# Patient Record
Sex: Male | Born: 1990
Health system: Southern US, Community
[De-identification: ages and names within clinical notes are randomized; demographics above are authoritative.]

## PROBLEM LIST (undated history)

## (undated) DIAGNOSIS — T7840XA Allergy, unspecified, initial encounter: Secondary | ICD-10-CM

## (undated) DIAGNOSIS — Z5189 Encounter for other specified aftercare: Secondary | ICD-10-CM

## (undated) DIAGNOSIS — G8929 Other chronic pain: Secondary | ICD-10-CM

## (undated) DIAGNOSIS — E785 Hyperlipidemia, unspecified: Secondary | ICD-10-CM

## (undated) DIAGNOSIS — R519 Headache, unspecified: Secondary | ICD-10-CM

## (undated) DIAGNOSIS — J45909 Unspecified asthma, uncomplicated: Secondary | ICD-10-CM

## (undated) DIAGNOSIS — H539 Unspecified visual disturbance: Secondary | ICD-10-CM

## (undated) DIAGNOSIS — F32A Depression, unspecified: Secondary | ICD-10-CM

## (undated) DIAGNOSIS — R569 Unspecified convulsions: Secondary | ICD-10-CM

## (undated) DIAGNOSIS — F329 Major depressive disorder, single episode, unspecified: Secondary | ICD-10-CM

## (undated) HISTORY — DX: Allergy, unspecified, initial encounter: T78.40XA

## (undated) HISTORY — DX: Headache, unspecified: R51.9

## (undated) HISTORY — DX: Depression, unspecified: F32.A

## (undated) HISTORY — DX: Unspecified visual disturbance: H53.9

## (undated) HISTORY — DX: Hyperlipidemia, unspecified: E78.5

## (undated) HISTORY — DX: Unspecified asthma, uncomplicated: J45.909

## (undated) HISTORY — DX: Other chronic pain: G89.29

---
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
## 2012-08-16 ENCOUNTER — Emergency Department (HOSPITAL_COMMUNITY): Payer: Self-pay

## 2012-08-16 ENCOUNTER — Encounter (HOSPITAL_COMMUNITY): Payer: Self-pay

## 2012-08-16 ENCOUNTER — Emergency Department (HOSPITAL_COMMUNITY)
Admission: EM | Admit: 2012-08-16 | Discharge: 2012-08-16 | Disposition: A | Discharge status: Home / Self Care | Payer: Self-pay | Attending: Emergency Medicine | Admitting: Emergency Medicine

## 2012-08-16 DIAGNOSIS — F17200 Nicotine dependence, unspecified, uncomplicated: Secondary | ICD-10-CM | POA: Insufficient documentation

## 2012-08-16 DIAGNOSIS — S93402A Sprain of unspecified ligament of left ankle, initial encounter: Secondary | ICD-10-CM

## 2012-08-16 DIAGNOSIS — W1809XA Striking against other object with subsequent fall, initial encounter: Secondary | ICD-10-CM | POA: Insufficient documentation

## 2012-08-16 DIAGNOSIS — S93409A Sprain of unspecified ligament of unspecified ankle, initial encounter: Secondary | ICD-10-CM | POA: Insufficient documentation

## 2012-08-16 DIAGNOSIS — Y9239 Other specified sports and athletic area as the place of occurrence of the external cause: Secondary | ICD-10-CM | POA: Insufficient documentation

## 2012-08-16 DIAGNOSIS — Y9367 Activity, basketball: Secondary | ICD-10-CM | POA: Insufficient documentation

## 2012-08-16 MED ORDER — HYDROCODONE-ACETAMINOPHEN 5-325 MG PO TABS
1.0000 | ORAL_TABLET | Freq: Once | ORAL | Status: AC
  Administered 2012-08-16: 1 via ORAL
  Filled 2012-08-16: qty 1

## 2012-08-16 MED ORDER — HYDROCODONE-ACETAMINOPHEN 5-325 MG PO TABS: ORAL_TABLET | ORAL | Status: DC

## 2012-08-16 MED ORDER — IBUPROFEN 600 MG PO TABS: ORAL_TABLET | ORAL | Status: DC

## 2012-08-16 MED ORDER — IBUPROFEN 800 MG PO TABS
800.0000 mg | ORAL_TABLET | Freq: Once | ORAL | Status: AC
  Administered 2012-08-16: 800 mg via ORAL
  Filled 2012-08-16: qty 1

## 2012-08-16 NOTE — ED Provider Notes (Signed)
Medical screening examination/treatment/procedure(s) were performed by non-physician practitioner and as supervising physician I was immediately available for consultation/collaboration.   Elliott L Wentz, MD 08/16/12 2335 

## 2012-08-16 NOTE — ED Provider Notes (Signed)
History     CSN: [id number]  Arrival date & time 08/16/12  1847   First MD Initiated Contact with Patient 08/16/12 1926      Chief Complaint  Patient presents with  . Ankle Pain    (Consider location/radiation/quality/duration/timing/severity/associated sxs/prior treatment) Patient is a 22 y.o. male presenting with ankle pain. The history is provided by the patient and a parent.  Ankle Pain Location:  Ankle Injury: yes   Mechanism of injury: fall   Fall:    Fall occurred:  Recreating/playing   Impact surface:  Athletic surface Ankle location:  L ankle Pain details:    Quality:  Throbbing   Radiates to:  Does not radiate   Severity:  Moderate   Onset quality:  Sudden   Timing:  Constant   Progression:  Worsening Chronicity:  New Dislocation: no   Foreign body present:  No foreign bodies Prior injury to area:  No Relieved by:  Nothing Worsened by:  Bearing weight Ineffective treatments:  None tried Associated symptoms: swelling   Associated symptoms: no back pain, no neck pain, no numbness and no tingling   Risk factors: no known bone disorder and no recent illness     History reviewed. No pertinent past medical history.  Past Surgical History  Procedure Laterality Date  . Brain surgery      hematoma    No family history on file.  History  Substance Use Topics  . Smoking status: Current Every Day Smoker  . Smokeless tobacco: Not on file  . Alcohol Use: No      Review of Systems  Constitutional: Negative for activity change.       All ROS Neg except as noted in HPI  HENT: Negative for nosebleeds and neck pain.   Eyes: Negative for photophobia and discharge.  Respiratory: Negative for cough, shortness of breath and wheezing.   Cardiovascular: Negative for chest pain and palpitations.  Gastrointestinal: Negative for abdominal pain and blood in stool.  Genitourinary: Negative for dysuria, frequency and hematuria.  Musculoskeletal: Negative for back pain  and arthralgias.  Skin: Negative.   Neurological: Negative for dizziness, seizures and speech difficulty.  Psychiatric/Behavioral: Negative for hallucinations and confusion.    Allergies  Penicillins and Cephalexin  Home Medications  No current outpatient prescriptions on file.  BP 125/63  Pulse 92  Temp(Src) 98.2 F (36.8 C) (Oral)  Resp 18  Ht 5\' 10"  (1.778 m)  Wt 188 lb (85.276 kg)  BMI 26.98 kg/m2  SpO2 99%  Physical Exam  Nursing note and vitals reviewed. Constitutional: He is oriented to person, place, and time. He appears well-developed and well-nourished.  Non-toxic appearance.  HENT:  Head: Normocephalic.  Right Ear: Tympanic membrane and external ear normal.  Left Ear: Tympanic membrane and external ear normal.  Eyes: EOM and lids are normal. Pupils are equal, round, and reactive to light.  Neck: Normal range of motion. Neck supple. Carotid bruit is not present.  Cardiovascular: Normal rate, regular rhythm, normal heart sounds, intact distal pulses and normal pulses.   Pulmonary/Chest: Breath sounds normal. No respiratory distress.  Abdominal: Soft. Bowel sounds are normal. There is no tenderness. There is no guarding.  Musculoskeletal: Normal range of motion.  There is pain and moderate swelling of the left lateral malleolus. There is full range of motion of the toes of the left foot. The Achilles tendon is intact. There is no deformity of the tib-fib area or the knee.  Lymphadenopathy:  Head (right side): No submandibular adenopathy present.       Head (left side): No submandibular adenopathy present.    He has no cervical adenopathy.  Neurological: He is alert and oriented to person, place, and time. He has normal strength. No cranial nerve deficit or sensory deficit.  Skin: Skin is warm and dry.  Psychiatric: He has a normal mood and affect. His speech is normal.    ED Course  Procedures (including critical care time)  Labs Reviewed - No data to  display Dg Ankle Complete Left  08/16/2012  *RADIOLOGY REPORT*  Clinical Data: Pain after basketball injury.  LEFT ANKLE COMPLETE - 3+ VIEW  Comparison: None.  Findings: Ankle mortise intact. Negative for fracture, dislocation, or other acute abnormality.  Normal alignment and mineralization. No significant degenerative change.  Regional soft tissues unremarkable.  IMPRESSION:  Negative   Original Report Authenticated By: D. Andria Rhein, MD      No diagnosis found.    MDM  I have reviewed nursing notes, vital signs, and all appropriate lab and imaging results for this patient. Patient states he was playing basketball when he landed on his left foot and ankle and a wrong angle and fell. The x-ray of the left ankle is negative for fracture or dislocation. I suspect the patient has a ankle sprain. The plan at this time is for the patient be fitted with an ankle stirrup splint, and an ice pack to be given. Patient states he has crutches at home and declines crutches at this time. Patient given a prescription for ibuprofen 600 mg 13 times daily, as well as Norco 5 mg one at bedtime. Patient is to see orthopedics if not improving.       Kathie Dike, PA-C 08/16/12 2258

## 2012-08-16 NOTE — ED Notes (Signed)
Pt reports was playing basketball.  Says jumped and landed wrong on left ankle.

## 2014-01-05 ENCOUNTER — Encounter (HOSPITAL_COMMUNITY): Payer: Self-pay | Admitting: Emergency Medicine

## 2014-01-05 ENCOUNTER — Emergency Department (INDEPENDENT_AMBULATORY_CARE_PROVIDER_SITE_OTHER): Payer: 59

## 2014-01-05 ENCOUNTER — Emergency Department (HOSPITAL_COMMUNITY)
Admission: EM | Admit: 2014-01-05 | Discharge: 2014-01-05 | Disposition: A | Discharge status: Home / Self Care | Payer: 59 | Source: Home / Self Care | Attending: Family Medicine | Admitting: Family Medicine

## 2014-01-05 DIAGNOSIS — S93609A Unspecified sprain of unspecified foot, initial encounter: Secondary | ICD-10-CM

## 2014-01-05 DIAGNOSIS — S93601A Unspecified sprain of right foot, initial encounter: Secondary | ICD-10-CM

## 2014-01-05 NOTE — Discharge Instructions (Signed)
Wear foot  support as needed for comfort, activity as tolerated. advil and ice as needed, return or see orthopedist if further problems.

## 2014-01-05 NOTE — ED Notes (Signed)
Pt  Reports  He  Injured  His  r   Foot    Yesterday   Landed   On  somebodys  Foot  Playing  Basketball  Pain and  Swelling  Present

## 2014-01-05 NOTE — ED Provider Notes (Addendum)
CSN: [id number]     Arrival date & time 01/05/14  1644 History   None    Chief Complaint  Patient presents with  . Foot Injury   (Consider location/radiation/quality/duration/timing/severity/associated sxs/prior Treatment) Patient is a 23 y.o. male presenting with ankle pain. The history is provided by the patient.  Ankle Pain Location:  Foot Time since incident:  1 day Injury: yes   Mechanism of injury comment:  Twisted playing bball. Foot location:  R foot Pain details:    Radiates to:  Does not radiate Chronicity:  Recurrent Dislocation: no   Foreign body present:  No foreign bodies Prior injury to area:  Yes Relieved by: camwalker splint. Associated symptoms: back pain, decreased ROM, stiffness and swelling     History reviewed. No pertinent past medical history. Past Surgical History  Procedure Laterality Date  . Brain surgery      hematoma   History reviewed. No pertinent family history. History  Substance Use Topics  . Smoking status: Current Every Day Smoker  . Smokeless tobacco: Not on file  . Alcohol Use: No    Review of Systems  Constitutional: Negative.   Musculoskeletal: Positive for back pain, gait problem, joint swelling and stiffness.  Skin: Negative.     Allergies  Penicillins and Cephalexin  Home Medications   Prior to Admission medications   Medication Sig Start Date End Date Taking? Authorizing Provider  HYDROcodone-acetaminophen (NORCO/VICODIN) 5-325 MG per tablet 1 at at bedtime if needed for pain, or one every 4 hours if needed for severe pain. 08/16/12   Lenox Ahr, PA-C  ibuprofen (ADVIL,MOTRIN) 600 MG tablet 1 po tid with food 08/16/12   Lenox Ahr, PA-C   BP 129/75  Pulse 66  Temp(Src) 97.5 F (36.4 C) (Oral)  SpO2 96% Physical Exam  Nursing note and vitals reviewed. Constitutional: He is oriented to person, place, and time. He appears well-developed and well-nourished.  Musculoskeletal: He exhibits tenderness.   Right foot: He exhibits tenderness and swelling. He exhibits no deformity and no laceration.       Feet:  Neurological: He is alert and oriented to person, place, and time.  Skin: Skin is warm and dry.    ED Course  Procedures (including critical care time) Labs Review Labs Reviewed - No data to display  Imaging Review Dg Foot Complete Right  01/05/2014   CLINICAL DATA:  Injury to the right foot complaining foot pain.  EXAM: RIGHT FOOT COMPLETE - 3+ VIEW  COMPARISON:  No priors.  FINDINGS: Multiple views of the right foot demonstrate no acute displaced fracture, subluxation, dislocation, or soft tissue abnormality.  IMPRESSION: No acute radiographic abnormality of the right foot.   Electronically Signed   By: Vinnie Langton M.D.   On: 01/05/2014 17:31    X-rays reviewed and report per radiologist.  MDM   1. Sprain of foot, right, initial encounter        Billy Fischer, MD 01/05/14 1731  Billy Fischer, MD 01/05/14 1901

## 2014-02-19 ENCOUNTER — Encounter: Payer: Self-pay | Admitting: Family Medicine

## 2014-02-19 ENCOUNTER — Ambulatory Visit (INDEPENDENT_AMBULATORY_CARE_PROVIDER_SITE_OTHER): Payer: 59 | Admitting: Family Medicine

## 2014-02-19 VITALS — BP 134/78 | Temp 98.8°F | Ht 70.0 in | Wt 200.0 lb

## 2014-02-19 DIAGNOSIS — Z23 Encounter for immunization: Secondary | ICD-10-CM

## 2014-02-19 DIAGNOSIS — T300 Burn of unspecified body region, unspecified degree: Secondary | ICD-10-CM

## 2014-02-19 MED ORDER — SILVER SULFADIAZINE 1 % EX CREA: 1.0000 "application " | TOPICAL_CREAM | Freq: Every day | CUTANEOUS | Status: DC

## 2014-02-19 MED ORDER — AZITHROMYCIN 250 MG PO TABS: ORAL_TABLET | ORAL | Status: DC
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
## 2014-02-22 ENCOUNTER — Other Ambulatory Visit: Payer: Self-pay | Admitting: Family Medicine

## 2014-02-25 ENCOUNTER — Encounter: Payer: Self-pay | Admitting: Family Medicine

## 2014-02-25 ENCOUNTER — Ambulatory Visit (INDEPENDENT_AMBULATORY_CARE_PROVIDER_SITE_OTHER): Payer: 59 | Admitting: Family Medicine

## 2014-02-25 VITALS — BP 130/80 | Ht 70.0 in | Wt 198.0 lb

## 2014-02-25 DIAGNOSIS — T300 Burn of unspecified body region, unspecified degree: Secondary | ICD-10-CM

## 2014-02-25 NOTE — Patient Instructions (Signed)
Clean wound and apply dressing tewice per d
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
## 2014-03-01 ENCOUNTER — Encounter: Payer: Self-pay | Admitting: Family Medicine

## 2014-03-01 ENCOUNTER — Ambulatory Visit (INDEPENDENT_AMBULATORY_CARE_PROVIDER_SITE_OTHER): Payer: 59 | Admitting: Family Medicine

## 2014-03-01 ENCOUNTER — Other Ambulatory Visit: Payer: Self-pay | Admitting: Family Medicine

## 2014-03-01 VITALS — BP 100/70 | Ht 70.0 in | Wt 198.0 lb

## 2014-03-01 DIAGNOSIS — T300 Burn of unspecified body region, unspecified degree: Secondary | ICD-10-CM

## 2014-03-01 MED ORDER — CLARITHROMYCIN 500 MG PO TABS: 500.0000 mg | ORAL_TABLET | Freq: Two times a day (BID) | ORAL | Status: DC
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
## 2014-03-13 DIAGNOSIS — Z0289 Encounter for other administrative examinations: Secondary | ICD-10-CM

## 2016-11-03 ENCOUNTER — Other Ambulatory Visit: Payer: Self-pay | Admitting: Internal Medicine

## 2016-11-03 DIAGNOSIS — H539 Unspecified visual disturbance: Secondary | ICD-10-CM

## 2016-11-03 DIAGNOSIS — R519 Headache, unspecified: Secondary | ICD-10-CM

## 2016-11-03 DIAGNOSIS — R51 Headache: Secondary | ICD-10-CM | POA: Diagnosis not present

## 2016-11-03 DIAGNOSIS — S06890A Other specified intracranial injury without loss of consciousness, initial encounter: Secondary | ICD-10-CM | POA: Diagnosis not present

## 2016-11-22 ENCOUNTER — Encounter: Payer: Self-pay | Admitting: *Deleted

## 2016-11-23 ENCOUNTER — Encounter: Payer: Self-pay | Admitting: Diagnostic Neuroimaging

## 2016-11-23 ENCOUNTER — Ambulatory Visit
Admission: RE | Admit: 2016-11-23 | Discharge: 2016-11-23 | Disposition: A | Discharge status: Home / Self Care | Payer: 59 | Source: Ambulatory Visit | Attending: Internal Medicine | Admitting: Internal Medicine

## 2016-11-23 ENCOUNTER — Ambulatory Visit (INDEPENDENT_AMBULATORY_CARE_PROVIDER_SITE_OTHER): Payer: 59 | Admitting: Diagnostic Neuroimaging

## 2016-11-23 ENCOUNTER — Encounter (INDEPENDENT_AMBULATORY_CARE_PROVIDER_SITE_OTHER): Payer: Self-pay

## 2016-11-23 VITALS — BP 118/67 | HR 57 | Ht 70.0 in | Wt <= 1120 oz

## 2016-11-23 DIAGNOSIS — R519 Headache, unspecified: Secondary | ICD-10-CM

## 2016-11-23 DIAGNOSIS — R51 Headache: Principal | ICD-10-CM

## 2016-11-23 DIAGNOSIS — G44329 Chronic post-traumatic headache, not intractable: Secondary | ICD-10-CM

## 2016-11-23 DIAGNOSIS — H539 Unspecified visual disturbance: Secondary | ICD-10-CM

## 2016-11-23 DIAGNOSIS — G44209 Tension-type headache, unspecified, not intractable: Secondary | ICD-10-CM | POA: Diagnosis not present

## 2016-11-23 DIAGNOSIS — G43009 Migraine without aura, not intractable, without status migrainosus: Secondary | ICD-10-CM
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
## 2016-11-23 NOTE — Patient Instructions (Signed)
Thank you for coming to see [place] at [facility]. I hope we have been able to provide you high quality care today.  You may receive a patient satisfaction survey over the next few weeks. We would appreciate your feedback and comments so that we may continue to improve ourselves and the health of our patients.   To prevent or relieve headaches, try the following:   Cool Compress. Lie down and place a cool compress on your head.   Avoid headache triggers. If certain foods or odors seem to have triggered your migraines in the past, avoid them. A headache diary might help you identify triggers.   Include physical activity in your daily routine.   Manage stress. Find healthy ways to cope with the stressors, such as delegating tasks on your to-do list.   Practice relaxation techniques. Try deep breathing, yoga, massage and visualization.   Eat regularly. Eating regularly scheduled meals and maintaining a healthy diet might help prevent headaches. Also, drink plenty of fluids.   Follow a regular sleep schedule. Sleep deprivation might contribute to headaches  Consider biofeedback. With this mind-body technique, you learn to control certain bodily functions - such as muscle tension, heart rate and blood pressure - to prevent headaches or reduce headache pain.  Consider melatonin to help reset sleep cycle. Take 1 tab before going to sleep, especially when changing sleep schedule.    ~~~~~~~~~~~~~~~~~~~~~~~~~~~~~~~~~~~~~~~~~~~~~~~~~~~~~~~~~~~~~~~~~  DR. Brannon Decaire'S GUIDE TO HAPPY AND HEALTHY LIVING These are some of my general health and wellness recommendations. Some of them may apply to you better than others. Please use common sense as you try these suggestions and feel free to ask me any questions.   ACTIVITY/FITNESS Mental, social, emotional and physical stimulation are very important for brain and body health. Try learning a new activity (arts, music, language,  sports, games).  Keep moving your body to the best of your abilities. You can do this at home, inside or outside, the park, community center, gym or anywhere you like. Consider a physical therapist or personal trainer to get started. Consider the app Sworkit. Fitness trackers such as smart-watches, smart-phones or Fitbits can help as well.   NUTRITION Eat more plants: colorful vegetables, nuts, seeds and berries.  Eat less sugar, salt, preservatives and processed foods.  Avoid toxins such as cigarettes and alcohol.  Drink water when you are thirsty. Warm water with a slice of lemon is an excellent morning drink to start the day.  Consider these websites for more information The Nutrition Source (https://www.henry-hernandez.biz/) [employer] Nutrition (WindowBlog.ch)   RELAXATION Consider practicing mindfulness meditation or other relaxation techniques such as deep breathing, prayer, yoga, tai chi, massage. See website mindful.org or the apps Headspace or Calm to help get started.   SLEEP Try to get at least 7-8+ hours sleep per day. Regular exercise and reduced caffeine will help you sleep better. Practice good sleep hygeine techniques. See website sleep.org for more information.   PLANNING Prepare estate planning, living will, healthcare POA documents. Sometimes this is best planned with the help of an attorney. Theconversationproject.org and agingwithdignity.org are excellent resources.

## 2016-12-27 DIAGNOSIS — Z0000 Encounter for general adult medical examination without abnormal findings: Secondary | ICD-10-CM | POA: Diagnosis not present

## 2017-01-13 DIAGNOSIS — R740 Nonspecific elevation of levels of transaminase and lactic acid dehydrogenase [LDH]: Secondary | ICD-10-CM | POA: Diagnosis not present

## 2017-01-13 DIAGNOSIS — R7989 Other specified abnormal findings of blood chemistry: Secondary | ICD-10-CM | POA: Diagnosis not present

## 2017-01-13 DIAGNOSIS — Z0000 Encounter for general adult medical examination without abnormal findings: Secondary | ICD-10-CM | POA: Diagnosis not present

## 2017-01-14 ENCOUNTER — Other Ambulatory Visit: Payer: Self-pay | Admitting: Internal Medicine

## 2017-01-14 DIAGNOSIS — R7401 Elevation of levels of liver transaminase levels: Secondary | ICD-10-CM

## 2017-01-14 DIAGNOSIS — R740 Nonspecific elevation of levels of transaminase and lactic acid dehydrogenase [LDH]: Principal | ICD-10-CM

## 2017-01-19 ENCOUNTER — Ambulatory Visit
Admission: RE | Admit: 2017-01-19 | Discharge: 2017-01-19 | Disposition: A | Discharge status: Home / Self Care | Payer: 59 | Source: Ambulatory Visit | Attending: Internal Medicine | Admitting: Internal Medicine

## 2017-01-19 DIAGNOSIS — R7401 Elevation of levels of liver transaminase levels: Secondary | ICD-10-CM

## 2017-01-19 DIAGNOSIS — R7989 Other specified abnormal findings of blood chemistry: Secondary | ICD-10-CM | POA: Diagnosis not present

## 2017-01-19 DIAGNOSIS — R740 Nonspecific elevation of levels of transaminase and lactic acid dehydrogenase [LDH]: Principal | ICD-10-CM

## 2017-03-15 ENCOUNTER — Encounter: Payer: Self-pay | Admitting: Diagnostic Neuroimaging

## 2017-03-15 ENCOUNTER — Encounter (INDEPENDENT_AMBULATORY_CARE_PROVIDER_SITE_OTHER): Payer: Self-pay

## 2017-03-15 ENCOUNTER — Ambulatory Visit (INDEPENDENT_AMBULATORY_CARE_PROVIDER_SITE_OTHER): Payer: 59 | Admitting: Diagnostic Neuroimaging

## 2017-03-15 VITALS — BP 135/70 | HR 58 | Wt 203.4 lb

## 2017-03-15 DIAGNOSIS — G43009 Migraine without aura, not intractable, without status migrainosus: Secondary | ICD-10-CM

## 2017-03-15 DIAGNOSIS — G44209 Tension-type headache, unspecified, not intractable: Secondary | ICD-10-CM | POA: Diagnosis not present

## 2017-03-15 DIAGNOSIS — G44329 Chronic post-traumatic headache, not intractable: Secondary | ICD-10-CM
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
## 2017-04-12 DIAGNOSIS — E785 Hyperlipidemia, unspecified: Secondary | ICD-10-CM | POA: Diagnosis not present

## 2017-04-22 DIAGNOSIS — E785 Hyperlipidemia, unspecified: Secondary | ICD-10-CM | POA: Diagnosis not present

## 2017-04-22 DIAGNOSIS — R748 Abnormal levels of other serum enzymes: Secondary | ICD-10-CM | POA: Diagnosis not present

## 2017-04-22 DIAGNOSIS — E039 Hypothyroidism, unspecified: Secondary | ICD-10-CM | POA: Diagnosis not present

## 2017-04-22 DIAGNOSIS — Z23 Encounter for immunization: Secondary | ICD-10-CM | POA: Diagnosis not present

## 2017-12-20 DIAGNOSIS — Z0000 Encounter for general adult medical examination without abnormal findings: Secondary | ICD-10-CM | POA: Diagnosis not present

## 2018-08-21 ENCOUNTER — Other Ambulatory Visit: Payer: Self-pay

## 2018-08-21 ENCOUNTER — Ambulatory Visit (HOSPITAL_COMMUNITY)
Admission: EM | Admit: 2018-08-21 | Discharge: 2018-08-21 | Disposition: A | Discharge status: Home / Self Care | Payer: 59 | Attending: Emergency Medicine | Admitting: Emergency Medicine

## 2018-08-21 ENCOUNTER — Encounter (HOSPITAL_COMMUNITY): Payer: Self-pay

## 2018-08-21 DIAGNOSIS — H6502 Acute serous otitis media, left ear: Secondary | ICD-10-CM | POA: Diagnosis not present

## 2018-08-21 DIAGNOSIS — R110 Nausea: Secondary | ICD-10-CM | POA: Diagnosis not present

## 2018-08-21 DIAGNOSIS — R519 Headache, unspecified: Secondary | ICD-10-CM

## 2018-08-21 DIAGNOSIS — J321 Chronic frontal sinusitis: Secondary | ICD-10-CM

## 2018-08-21 DIAGNOSIS — R51 Headache: Secondary | ICD-10-CM | POA: Diagnosis not present

## 2018-08-21 MED ORDER — PREDNISONE 10 MG (21) PO TBPK: ORAL_TABLET | Freq: Every day | ORAL | 0 refills | Status: DC

## 2018-08-21 MED ORDER — AZITHROMYCIN 250 MG PO TABS: 250.0000 mg | ORAL_TABLET | Freq: Every day | ORAL | 0 refills | Status: DC

## 2018-08-21 NOTE — ED Provider Notes (Signed)
[place]    CSN: [id number] Arrival date & time: 08/21/18  0815     History   Chief Complaint Chief Complaint  Patient presents with  . Nausea    HPI James Andrade is a 28 y.o. male.   Nausea, sinus pressure, headache, and not feeling well for the past 3 days now. States no emesis no loose bowels,no fevers  has not taken anything pta.   Wife was concerned about dark stools pt did not feel this was an issue. She wanted to know if we could refer to GI       History reviewed. No pertinent past medical history.  Patient Active Problem List   Diagnosis Date Noted  . SINUS BRADYCARDIA 03/24/2010  . WRIST PAIN, LEFT 09/18/2008  . ANKLE PAIN 09/18/2008  . FRACTURE, MEDIAL MALLEOLUS 04/17/2007    Past Surgical History:  Procedure Laterality Date  . BRAIN SURGERY  2010   hematoma from fall onto back of head, Dr Sherwood Gambler  . WISDOM TOOTH EXTRACTION  2012       Home Medications    Prior to Admission medications   Medication Sig Start Date End Date Taking? Authorizing Provider  azithromycin (ZITHROMAX) 250 MG tablet Take 1 tablet (250 mg total) by mouth daily. Take first 2 tablets together, then 1 every day until finished. 08/21/18   Marney Setting, NP  ibuprofen (ADVIL,MOTRIN) 200 MG tablet Take 200 mg by mouth every 6 (six) hours as needed.    [provider]  Melatonin 5 MG TABS Take by mouth.    [provider]  predniSONE (STERAPRED UNI-PAK 21 TAB) 10 MG (21) TBPK tablet Take by mouth daily. Take 6 tabs by mouth daily  for 2 days, then 5 tabs for 2 days, then 4 tabs for 2 days, then 3 tabs for 2 days, 2 tabs for 2 days, then 1 tab by mouth daily for 2 days 08/21/18   Marney Setting, NP    Family History History reviewed. No pertinent family history.  Social History Social History   Tobacco Use  . Smoking status: Current Every Day Smoker    Types: Cigarettes  . Smokeless tobacco: Never Used  . Tobacco comment:  11/23/16 10 daily, 03/15/17 cut back to a few daily  Substance Use Topics  . Alcohol use: Yes    Comment: 11/26/16 2 weekly  . Drug use: No     Allergies   Penicillins; Sulfa antibiotics; and Cephalexin   Review of Systems Review of Systems  Constitutional: Positive for fatigue.  HENT: Positive for congestion, ear pain, postnasal drip, sinus pressure, sinus pain and sneezing.   Eyes: Negative.   Respiratory: Negative.   Cardiovascular: Negative.   Gastrointestinal: Positive for nausea.       Wife with pt was concerned about blood in stool   Genitourinary: Negative.   Musculoskeletal: Negative.   Neurological: Positive for headaches.     Physical Exam Triage Vital Signs ED Triage Vitals  Enc Vitals Group     BP 08/21/18 0900 130/78     Pulse Rate 08/21/18 0900 76     Resp 08/21/18 0900 18     Temp 08/21/18 0900 98.3 F (36.8 C)     Temp Source 08/21/18 0900 Oral     SpO2 08/21/18 0900 97 %     Weight --      Height --      Head Circumference --      Peak Flow --  Pain Score 08/21/18 0856 4     Pain Loc --      Pain Edu? --      Excl. in [place]? --    No data found.  Updated Vital Signs BP 130/78 (BP Location: Right Arm)   Pulse 76   Temp 98.3 F (36.8 C) (Oral)   Resp 18   SpO2 97%   Visual Acuity     Physical Exam HENT:     Head: Normocephalic.     Ears:     Comments: bil bulging to TM , LT TM erythema     Nose: Congestion and rhinorrhea present.     Mouth/Throat:     Mouth: Mucous membranes are moist.  Eyes:     Pupils: Pupils are equal, round, and reactive to light.  Neck:     Musculoskeletal: Normal range of motion.  Cardiovascular:     Rate and Rhythm: Normal rate.     Pulses: Normal pulses.  Pulmonary:     Effort: Pulmonary effort is normal.  Abdominal:     General: Abdomen is flat. Bowel sounds are normal.  Musculoskeletal: Normal range of motion.  Skin:    General: Skin is warm.  Neurological:     General: No focal deficit  present.     Mental Status: He is alert.     Comments: Facial pressure to frontal and sinus       UC Treatments / Results  Labs (all labs ordered are listed, but only abnormal results are displayed) Labs Reviewed - No data to display  EKG None  Radiology No results found.  Procedures Procedures (including critical care time)  Medications Ordered in UC Medications - No data to display  Initial Impression / Assessment and Plan / UC Course  I have reviewed the triage vital signs and the nursing notes.  Pertinent labs & imaging results that were available during my care of the patient were reviewed by me and considered in my medical decision making (see chart for details).     Use humidifier to help  Take tylenol and motrin  For pain  Clear fluids  Ear infection  Stay hydrated  Final Clinical Impressions(s) / UC Diagnoses   Final diagnoses:  Nausea without vomiting  Chronic frontal sinusitis  Non-recurrent acute serous otitis media of left ear  Acute nonintractable headache, unspecified headache type     Discharge Instructions     Use humidifier to help  Take tylenol and motrin  For pain  Clear fluids  Ear infection  Stay hydrated     ED Prescriptions    Medication Sig Dispense Auth. Provider   predniSONE (STERAPRED UNI-PAK 21 TAB) 10 MG (21) TBPK tablet Take by mouth daily. Take 6 tabs by mouth daily  for 2 days, then 5 tabs for 2 days, then 4 tabs for 2 days, then 3 tabs for 2 days, 2 tabs for 2 days, then 1 tab by mouth daily for 2 days 42 tablet Morley Kos L, NP   azithromycin (ZITHROMAX) 250 MG tablet Take 1 tablet (250 mg total) by mouth daily. Take first 2 tablets together, then 1 every day until finished. 6 tablet Marney Setting, NP     Controlled Substance Prescriptions [place] Controlled Substance Registry consulted? Not Applicable   Marney Setting, NP 08/21/18 [phone]

## 2018-08-21 NOTE — ED Triage Notes (Signed)
Pt cc he states he feels congested and nauseous this started this morning at work. Pt has body aches.

## 2018-08-21 NOTE — Discharge Instructions (Addendum)
Use humidifier to help  Take tylenol and motrin  For pain  Clear fluids  Ear infection  Stay hydrated

## 2018-12-26 NOTE — Progress Notes (Signed)
TELEHEALTH VISIT  Referring Provider: Deland Pretty, MD Primary Care Physician:  Deland Pretty, MD   Tele-visit due to COVID-19 pandemic Patient requested visit virtually, consented to the virtual encounter via video enabled telemedicine application Contact made at: 14:31 12/27/18 Patient verified by name and date of birth Location of patient: Home Location provider: [place] [facility] Names of persons participating: Me, patient, Tinnie Gens CMA Time spent on telehealth visit: 28 minutes I discussed the limitations of evaluation and management by telemedicine. The patient expressed understanding and agreed to proceed.  Reason for Consultation: Bloody stools   IMPRESSION:  Rectal bleeding and rectal bulge reported by patient No known family history of colon cancer or polyps  The differential for rectal bleeding is broad.  It includes outlet sources such as fissure or hemorrhoids, as well as polyps, mass, ulcers, and colitis.  Given this differential I am recommending a colonoscopy.  PLAN: High fiber diet recommended, drink at least 1.5-2 liters of water Daily stool bulking agent with psyllium or methylcellulose recommended Colonoscopy  I consented the patient discussing the risks, benefits, and alternatives to endoscopic evaluation. In particular, we discussed the risks that include, but are not limited to, reaction to medication, cardiopulmonary compromise, bleeding requiring blood transfusion, aspiration resulting in pneumonia, perforation requiring surgery, lack of diagnosis, severe illness requiring hospitalization, and even death. We reviewed the risk of missed lesion including polyps or even cancer. The patient acknowledges these risks and asks that we proceed.  Please see the "Patient Instructions" section for addition details about the plan.  HPI: James Andrade is a 28 y.o. male referred by an Urgent Care in March 2020. The history is obtained through the  patient. His wife has been persistent about him coming to this appointment because of having some blood in the stool. She is a chemotherapy nurse at [employer].  January started noted BRB on the toilet paper and in the bowl with some bowel movements. Rectal pain with defecation is always there.  In March noted a rectal bulge and a right sided fullness. Wonders if it could be ripped from straining. No rectal exam has been performed by the patient or a health care provider.  No other associated symptoms. No identified exacerbating or relieving features.   Recently developed anorexia over the last month. Intermittent nausea over the last few weeks to months. Weight gain during quarantine. No other systemic complaints.   Prior abdominal imaging: Abdominal ultrasound 01/19/17: normal  No known family history of colon cancer or polyps. No family history of uterine/endometrial cancer, pancreatic cancer or gastric/stomach cancer.  No past medical history on file.  Past Surgical History:  Procedure Laterality Date  . BRAIN SURGERY  2010   hematoma from fall onto back of head, Dr Sherwood Gambler  . WISDOM TOOTH EXTRACTION  2012    Current Outpatient Medications  Medication Sig Dispense Refill  . azithromycin (ZITHROMAX) 250 MG tablet Take 1 tablet (250 mg total) by mouth daily. Take first 2 tablets together, then 1 every day until finished. 6 tablet 0  . ibuprofen (ADVIL,MOTRIN) 200 MG tablet Take 200 mg by mouth every 6 (six) hours as needed.    . Melatonin 5 MG TABS Take by mouth.    . predniSONE (STERAPRED UNI-PAK 21 TAB) 10 MG (21) TBPK tablet Take by mouth daily. Take 6 tabs by mouth daily  for 2 days, then 5 tabs for 2 days, then 4 tabs for 2 days, then 3 tabs for 2 days, 2 tabs  for 2 days, then 1 tab by mouth daily for 2 days 42 tablet 0   No current facility-administered medications for this visit.     Allergies as of 12/27/2018 - Review Complete 08/21/2018  Allergen Reaction Noted  . Penicillins  Swelling   . Sulfa antibiotics Hives 08/21/2018  . Cephalexin Swelling     No family history on file.  Social History   Socioeconomic History  . Marital status: Married    Spouse name: Lauren  . Number of children: 0  . Years of education: 44  . Highest education level: Not on file  Occupational History    Comment: [employer] and [employer]  Social Needs  . Financial resource strain: Not on file  . Food insecurity    Worry: Not on file    Inability: Not on file  . Transportation needs    Medical: Not on file    Non-medical: Not on file  Tobacco Use  . Smoking status: Current Every Day Smoker    Types: Cigarettes  . Smokeless tobacco: Never Used  . Tobacco comment: 11/23/16 10 daily, 03/15/17 cut back to a few daily  Substance and Sexual Activity  . Alcohol use: Yes    Comment: 11/26/16 2 weekly  . Drug use: No  . Sexual activity: Not on file  Lifestyle  . Physical activity    Days per week: Not on file    Minutes per session: Not on file  . Stress: Not on file  Relationships  . Social Herbalist on phone: Not on file    Gets together: Not on file    Attends religious service: Not on file    Active member of club or organization: Not on file    Attends meetings of clubs or organizations: Not on file    Relationship status: Not on file  . Intimate partner violence    Fear of current or ex partner: Not on file    Emotionally abused: Not on file    Physically abused: Not on file    Forced sexual activity: Not on file  Other Topics Concern  . Not on file  Social History Narrative  . Not on file    Review of Systems: ALL ROS discussed and all others negative except listed in HPI.  Physical Exam: Complete physical exam not performed due to the limits inherent in a telehealth encounter.  General: Awake, alert, and oriented, and well communicative. In no acute distress.  HEENT: EOMI, non-icteric sclera, NCAT, MMM  Neck: Normal movement of head and neck  Pulm:  No labored breathing, speaking in full sentences without conversational dyspnea  Derm: No apparent lesions or bruising in visible field  MS: Moves all visible extremities without noticeable abnormality  Psych: Pleasant, cooperative, normal speech, normal affect and normal insight Neuro: Alert and appropriate   Keaghan Bowens L. Tarri Glenn, MD, MPH [facility] 12/26/2018, 12:44 PM

## 2018-12-27 ENCOUNTER — Encounter: Payer: Self-pay | Admitting: Gastroenterology

## 2018-12-27 ENCOUNTER — Ambulatory Visit (INDEPENDENT_AMBULATORY_CARE_PROVIDER_SITE_OTHER): Payer: 59 | Admitting: Gastroenterology

## 2018-12-27 VITALS — Ht 70.0 in | Wt 225.0 lb

## 2018-12-27 DIAGNOSIS — K625 Hemorrhage of anus and rectum: Secondary | ICD-10-CM

## 2018-12-27 MED ORDER — NA SULFATE-K SULFATE-MG SULF 17.5-3.13-1.6 GM/177ML PO SOLN: 1.0000 | ORAL | 0 refills | Status: DC

## 2018-12-27 NOTE — Patient Instructions (Addendum)
Please follow high-fiber diet.  I recommend slowly increasing the fiber in your diet to at least 25 to 30 g daily.  Fiber can be found in fruits, vegetables, and bran fibers.  You can also find fiber and supplements including fiber gummies that allow you to meet this daily recommendation more easily.  Drink at least 64 ounces of water daily.  Consider adding a stool bulking agent with psyllium or methylcellulose such as MiraLAX.  I have recommended a colonoscopy for further evaluation of your bleeding.  Please call me with any questions or concerns in the meantime.

## 2019-01-10 ENCOUNTER — Encounter: Payer: Self-pay | Admitting: Gastroenterology

## 2019-01-13 HISTORY — PX: COLONOSCOPY: SHX174

## 2019-01-16 ENCOUNTER — Other Ambulatory Visit: Payer: Self-pay

## 2019-01-16 DIAGNOSIS — Z20822 Contact with and (suspected) exposure to covid-19: Secondary | ICD-10-CM

## 2019-01-17 LAB — NOVEL CORONAVIRUS, NAA: SARS-CoV-2, NAA: NOT DETECTED

## 2019-01-18 ENCOUNTER — Telehealth: Payer: Self-pay | Admitting: Gastroenterology

## 2019-01-18 NOTE — Telephone Encounter (Signed)
Spoke with patient regarding Covid-19 screening questions. Covid-19 Screening Questions:  Do you now or have you had a fever in the last 14 days? no  Do you have any respiratory symptoms of shortness of breath or cough now or in the last 14 days? no  Do you have any family members or close contacts with diagnosed or suspected Covid-19 in the past 14 days?  Wife has been tested and pending results--Wife had temp on Tuesday and body aches  Have you been tested for Covid-19 and found to be positive? no  Pt made aware of that care partner may wait in the car or come up to the lobby during the procedure but will need to provide their own mask.

## 2019-01-18 NOTE — Telephone Encounter (Signed)
Spoke with patient and since 1st phone call he got results back and he and his wife's were negative for covid.  Will plan to proceed with procedure tomorrow.

## 2019-01-19 ENCOUNTER — Encounter: Payer: Self-pay | Admitting: Gastroenterology

## 2019-01-19 ENCOUNTER — Ambulatory Visit (AMBULATORY_SURGERY_CENTER): Payer: 59 | Admitting: Gastroenterology

## 2019-01-19 ENCOUNTER — Other Ambulatory Visit: Payer: Self-pay

## 2019-01-19 VITALS — BP 103/46 | HR 51 | Temp 98.4°F | Resp 19 | Ht 70.0 in | Wt 225.0 lb

## 2019-01-19 DIAGNOSIS — D120 Benign neoplasm of cecum: Secondary | ICD-10-CM | POA: Diagnosis not present

## 2019-01-19 DIAGNOSIS — D122 Benign neoplasm of ascending colon: Secondary | ICD-10-CM

## 2019-01-19 DIAGNOSIS — K625 Hemorrhage of anus and rectum: Secondary | ICD-10-CM | POA: Diagnosis not present

## 2019-01-19 DIAGNOSIS — D125 Benign neoplasm of sigmoid colon: Secondary | ICD-10-CM | POA: Diagnosis not present

## 2019-01-19 DIAGNOSIS — D123 Benign neoplasm of transverse colon: Secondary | ICD-10-CM

## 2019-01-19 MED ORDER — AMBULATORY NON FORMULARY MEDICATION: 1 refills | Status: DC

## 2019-01-19 MED ORDER — SODIUM CHLORIDE 0.9 % IV SOLN: 500.0000 mL | Freq: Once | INTRAVENOUS | Status: DC

## 2019-01-19 NOTE — Patient Instructions (Signed)
YOU HAD AN ENDOSCOPIC PROCEDURE TODAY AT THE [facility]:   Refer to the procedure report that was given to you for any specific questions about what was found during the examination.  If the procedure report does not answer your questions, please call your gastroenterologist to clarify.  If you requested that your care partner not be given the details of your procedure findings, then the procedure report has been included in a sealed envelope for you to review at your convenience later.  YOU SHOULD EXPECT: Some feelings of bloating in the abdomen. Passage of more gas than usual.  Walking can help get rid of the air that was put into your GI tract during the procedure and reduce the bloating. If you had a lower endoscopy (such as a colonoscopy or flexible sigmoidoscopy) you may notice spotting of blood in your stool or on the toilet paper. If you underwent a bowel prep for your procedure, you may not have a normal bowel movement for a few days.  Please Note:  You might notice some irritation and congestion in your nose or some drainage.  This is from the oxygen used during your procedure.  There is no need for concern and it should clear up in a day or so.  SYMPTOMS TO REPORT IMMEDIATELY:   Following lower endoscopy (colonoscopy or flexible sigmoidoscopy):  Excessive amounts of blood in the stool  Significant tenderness or worsening of abdominal pains  Swelling of the abdomen that is new, acute  Fever of 100F or higher  For urgent or emergent issues, a gastroenterologist can be reached at any hour by calling [phone].   DIET:  We do recommend a small meal at first, but then you may proceed to your regular diet.  Drink plenty of fluids but you should avoid alcoholic beverages for 24 hours.  ACTIVITY:  You should plan to take it easy for the rest of today and you should NOT DRIVE or use heavy machinery until tomorrow (because of the sedation medicines used during the test).     FOLLOW UP: Our staff will call the number listed on your records 48-72 hours following your procedure to check on you and address any questions or concerns that you may have regarding the information given to you following your procedure. If we do not reach you, we will leave a message.  We will attempt to reach you two times.  During this call, we will ask if you have developed any symptoms of COVID 19. If you develop any symptoms (ie: fever, flu-like symptoms, shortness of breath, cough etc.) before then, please call [phone].  If you test positive for Covid 19 in the 2 weeks post procedure, please call and report this information to us.    If any biopsies were taken you will be contacted by phone or by letter within the next 1-3 weeks.  Please call us at [phone] if you have not heard about the biopsies in 3 weeks.    SIGNATURES/CONFIDENTIALITY: You and/or your care partner have signed paperwork which will be entered into your electronic medical record.  These signatures attest to the fact that that the information above on your After Visit Summary has been reviewed and is understood.  Full responsibility of the confidentiality of this discharge information lies with you and/or your care-partner. 

## 2019-01-19 NOTE — Progress Notes (Signed)
To PACU, VSS. Report to [email or web address] 

## 2019-01-19 NOTE — Op Note (Signed)
[place] Patient Name: James Andrade Procedure Date: 01/19/2019 2:16 PM MRN: [id number] Endoscopist: Thornton Park MD, MD Age: 28 Referring MD:  Date of Birth: [birth year] Gender: Male Account #: [id number] Procedure:                Colonoscopy Indications:              Rectal bleeding                           No known family history of colon cancer or polyps Medicines:                See the Anesthesia note for documentation of the                            administered medications Procedure:                Pre-Anesthesia Assessment:                           - Prior to the procedure, a History and Physical                            was performed, and patient medications and                            allergies were reviewed. The patient's tolerance of                            previous anesthesia was also reviewed. The risks                            and benefits of the procedure and the sedation                            options and risks were discussed with the patient.                            All questions were answered, and informed consent                            was obtained. Prior Anticoagulants: The patient has                            taken no previous anticoagulant or antiplatelet                            agents. ASA Grade Assessment: II - A patient with                            mild systemic disease. After reviewing the risks                            and benefits, the patient was deemed in  satisfactory condition to undergo the procedure.                           After obtaining informed consent, the colonoscope                            was passed under direct vision. Throughout the                            procedure, the patient's blood pressure, pulse, and                            oxygen saturations were monitored continuously. The                            Colonoscope was introduced through the  anus and                            advanced to the the terminal ileum, with                            identification of the appendiceal orifice and IC                            valve. A second forward view of the right colon was                            performed. The colonoscopy was performed without                            difficulty. The patient tolerated the procedure                            well. The quality of the bowel preparation was                            good. The terminal ileum, ileocecal valve,                            appendiceal orifice, and rectum were photographed. Scope In: 2:25:40 PM Scope Out: 2:42:40 PM Scope Withdrawal Time: 0 hours 13 minutes 25 seconds  Total Procedure Duration: 0 hours 17 minutes 0 seconds  Findings:                 A posterior anal fissure was found on perianal exam.                           Three sessile polyps were found in the sigmoid                            colon (22mm), ascending colon (75mm) and cecum (39mm).                            The polyps were 1  to 3 mm in size. These polyps                            were removed with a cold snare. Resection and                            retrieval were complete. Estimated blood loss was                            minimal.                           A 1 mm polyp was found in the transverse colon. The                            polyp was sessile. The polyp was removed with a                            cold biopsy forceps. Resection and retrieval were                            complete. Estimated blood loss was minimal.                           The exam was otherwise without abnormality on                            direct and retroflexion views. Complications:            No immediate complications. Estimated blood loss:                            Minimal. Estimated Blood Loss:     Estimated blood loss was minimal. Impression:               - Anal fissure found on perianal exam.                            - Three 1 to 3 mm polyps in the sigmoid colon, in                            the ascending colon and in the cecum, removed with                            a cold snare. Resected and retrieved.                           - One 1 mm polyp in the transverse colon, removed                            with a cold biopsy forceps. Resected and retrieved.                           - The examination was otherwise normal on  direct                            and retroflexion views. Recommendation:           - Patient has a contact number available for                            emergencies. The signs and symptoms of potential                            delayed complications were discussed with the                            patient. Return to normal activities tomorrow.                            Written discharge instructions were provided to the                            patient.                           - Resume previous diet today. High fiber diet                            recommending - increasing both dietery fiber (35                            grams daily) and water intake.                           - Dailly Metamucil, add Miralax 17 g daily if                            needed to keep stools soft                           - Continue present medications.                           - Nitroglycerin 0.125% gel applied to the rectum                            TID x 8 weeks                           - Sitz baths 2-3 times daily for pain relief.                           - Await pathology results.                           - Repeat colonoscopy date to be determined after  pending pathology results are reviewed for                            surveillance based on pathology results.                           - Follow-up visit in 6 weeks. Thornton Park MD, MD 01/19/2019 2:50:25 PM This report has been signed electronically.

## 2019-01-19 NOTE — Progress Notes (Signed)
Called to room to assist during endoscopic procedure.  Patient ID and intended procedure confirmed with present staff. Received instructions for my participation in the procedure from the performing physician.  

## 2019-01-22 ENCOUNTER — Telehealth: Payer: Self-pay | Admitting: Gastroenterology

## 2019-01-22 ENCOUNTER — Encounter: Payer: Self-pay | Admitting: *Deleted

## 2019-01-22 NOTE — Telephone Encounter (Signed)
Instead of the nitroglycerin, I hope he would have few side effects with Nifedipine with 5% lidocaine applied at the anal opening 2-3 times a day for 6-12 weeks. Thank you.

## 2019-01-22 NOTE — Telephone Encounter (Signed)
Spoke with patient and called Rx into [facility]. Nifedipine 0.03% with 5% lidocaine. Pharmacist states she will call patient when Rx is ready. Patient verbalized understanding.

## 2019-01-22 NOTE — Telephone Encounter (Signed)
Confirmed dose with patient he does have the 0.125 mg compound from [facility] and he has only used it twice a day.

## 2019-01-22 NOTE — Telephone Encounter (Signed)
Please confirm the correct dose of nitroglycerin. Sounds like the strength may be too high. Thank you.

## 2019-01-22 NOTE — Telephone Encounter (Signed)
Pt reported that nitroglycerin is causing nausea and abdominal cramping.  Please advise.

## 2019-01-22 NOTE — Telephone Encounter (Signed)
Patient states he started nitroglycerin yesterday and has felt lightheaded and dizzy. Had to stay home from work today. Some abdominal pain. Please advise.

## 2019-01-23 ENCOUNTER — Telehealth: Payer: Self-pay

## 2019-01-23 ENCOUNTER — Telehealth: Payer: Self-pay | Admitting: *Deleted

## 2019-01-23 NOTE — Telephone Encounter (Signed)
  Follow up Call-  Call back number 01/19/2019  Post procedure Call Back phone  # [phone]  Permission to leave phone message Yes  Some recent data might be hidden     Patient questions:  Do you have a fever, pain , or abdominal swelling? No. Pain Score  0 *  Have you tolerated food without any problems? Yes.    Have you been able to return to your normal activities? Yes.    Do you have any questions about your discharge instructions: Diet   No. Medications  No. Follow up visit  No.  Do you have questions or concerns about your Care? No.  Actions: * If pain score is 4 or above: No action needed, pain <4. 1. Have you developed a fever since your procedure? no  2.   Have you had an respiratory symptoms (SOB or cough) since your procedure? no  3.   Have you tested positive for COVID 19 since your procedure no  4.   Have you had any family members/close contacts diagnosed with the COVID 19 since your procedure?  no   If yes to any of these questions please route to Joylene John, RN and Alphonsa Gin, Therapist, sports.

## 2019-01-23 NOTE — Telephone Encounter (Signed)
No answer, mailbox is full unable to leave message.

## 2019-01-24 ENCOUNTER — Encounter: Payer: Self-pay | Admitting: Gastroenterology

## 2019-03-01 ENCOUNTER — Ambulatory Visit: Payer: 59 | Admitting: Gastroenterology

## 2019-03-01 ENCOUNTER — Other Ambulatory Visit: Payer: Self-pay

## 2019-03-01 ENCOUNTER — Encounter: Payer: Self-pay | Admitting: Gastroenterology

## 2019-03-01 ENCOUNTER — Other Ambulatory Visit (INDEPENDENT_AMBULATORY_CARE_PROVIDER_SITE_OTHER): Payer: 59

## 2019-03-01 VITALS — BP 110/60 | HR 57 | Temp 97.1°F | Ht 70.0 in | Wt 220.0 lb

## 2019-03-01 DIAGNOSIS — R748 Abnormal levels of other serum enzymes: Secondary | ICD-10-CM

## 2019-03-01 DIAGNOSIS — E559 Vitamin D deficiency, unspecified: Secondary | ICD-10-CM | POA: Diagnosis not present

## 2019-03-01 DIAGNOSIS — K625 Hemorrhage of anus and rectum: Secondary | ICD-10-CM | POA: Diagnosis not present

## 2019-03-01 LAB — GLUCOSE, RANDOM: Glucose, Bld: 87 mg/dL (ref 70–99)

## 2019-03-01 LAB — IRON: Iron: 142 ug/dL (ref 42–165)

## 2019-03-01 LAB — FERRITIN: Ferritin: 81.7 ng/mL (ref 22.0–322.0)

## 2019-03-01 NOTE — Progress Notes (Signed)
Referring Provider: Deland Pretty, MD Primary Care Physician:  Deland Pretty, MD   Chief complaint: Bloody stools   IMPRESSION:  Anal fissure    - intolerant to nitroglycerin due to headaches     - responding to nifedipine 0.03% with 5% lidocaine Abnormal transaminases    - labs 12/26/18: AST 55,  ALT 118      - abdominal ultrasound 01/19/17: normal Vitamin D deficiency Personal history of colon polyps    - 3 tubular adenomas, one sessile serrated adenoma on colonoscopy 01/19/19    - surveillance colonoscopy recommended in 2023   Suspected hepatocellular process. Will proceed with labs to evaluate for common causes of hepatocellular injury including: viral hepatitis serologies, iron, ferritin, fasting insulin, fasting glucose,and autoimmune type I (ANA, AMA, ASMA, IgG). If these labs are non-diagnostic, will proceed with alpha-1-antitrypsin, TSH, and celiac serologies.   Given this patient's young age, will also include serologies for autoimmune type II (anti-LKM, anti-liver cytosol, anti-SLA if autoimmune type I neg) and Wilson's disease.   Continue treatment for anal fissure.  Review pathology results. Plan surveillance colonoscopy in 3 years. His siblings should plan to start colon cancer screening early.   PLAN: High fiber diet recommended, drink at least 1.5-2 liters of water Continue daily stool bulking agent with psyllium or methylcellulose Labs: Hepatitis C antibody, hepatitis B surface antigen, hepatitis B core antibody, fasting ferritin, fasting insulin, fasting glucose, iron, ANA, AMA, anti-smooth muscle antibody, ANCA, IgG, IgM, LKM antibody, ceruloplasim Obtain prior labs of liver disease over the last 5 years Consider repeat abdominal imaging if the liver enzymes are trending up Colonoscopy in 2023  Please see the "Patient Instructions" section for addition details about the plan.  HPI: James Andrade is a 28 y.o. male referred by an Urgent Care in March  2020. The history is obtained through the patient. His wife has been persistent about him coming to this appointment because of having some blood in the stool. She is a chemotherapy nurse at [employer].  Colonoscopy 01/19/19 - A posterior anal fissure was found on perianal exam. - Three sessile polyps were found in the sigmoid colon (36m), ascending colon (126m and cecum (31m46m The polyps were 1 to 3 mm in size. These polyps were removed with a cold snare. Resection and retrieval were complete. Estimated blood loss was minimal. - A 1 mm polyp was found in the transverse colon. The polyp was sessile. The polyp was removed with a cold biopsy forceps. Resection and retrieval were complete. Estimated blood loss was minimal. - The exam was otherwise without abnormality on direct and retroflexion views.  Pathology results:  Surgical [P], colon, sigmoid, transverse, ascending and cecum, polyp (5) - TUBULAR ADENOMA(S). - SESSILE SERRATED POLYP WITHOUT CYTOLOGIC DYSPLASIA. - HIGH GRADE DYSPLASIA IS NOT IDENTIFIED.  No further bleeding since the colonoscopy. One episode of constipation with pain.   This summer, Dr. PhaShelia Mediant over the following labs obtained on routine annual evaluation:  Labs 12/26/18: WBC 10.2, hgb 16.6, platelets 250, RDW 12.4, MCV 87.7, AST 55, ALT 115, alk phos 84, alb 4.9, TB 0.7, crt 0.97, Na 139, Lipids abnormal (cholesterol 329, HDL 37, LDL 239, triglycerides 267), TSH 4.11, UA negative for bilirubin, Vit D low at 23.8  Patient reports liver enzymes were elevated in the past. Abdominal ultrasound was normal in 2018.  He was told that it could be his cholesterol.  No associated symptoms. No identified exacerbating or relieving features.   Blood donation in high school.  No prior blood transfusion.  No history of jaundice or scleral icterus.  No history of use or experimentation with IV or intranasal street drugs.  No history of autoimmune disease.  No family history of liver disease.   He is now on a low carb diet. Using fiber supplements, fish oil, and red yeast.    Prior abdominal imaging: Abdominal ultrasound 01/19/17: normal  Endoscopic history: 01/19/19: posterior anal fissure, 3 small tubular adenomas and 1 sessile serrated polyp  Past Medical History:  Diagnosis Date  . Asthma    childhood  . Blood transfusion without reported diagnosis 2010   thinks after head injury  . Depression    states after head injury, not on meds  . Hyperlipidemia    not on meds  . Seizures ([place]) 2010   did after brain trauma    Past Surgical History:  Procedure Laterality Date  . BRAIN SURGERY  2010   hematoma from fall onto back of head, Dr Sherwood Gambler  . WISDOM TOOTH EXTRACTION  2012    Current Outpatient Medications  Medication Sig Dispense Refill  . AMBULATORY NON FORMULARY MEDICATION Medication Name: lidocaine cream to rectom     No current facility-administered medications for this visit.     Allergies as of 03/01/2019 - Review Complete 03/01/2019  Allergen Reaction Noted  . Penicillins Swelling   . Sulfa antibiotics Hives 08/21/2018  . Cephalexin Swelling     History reviewed. No pertinent family history.  Social History   Socioeconomic History  . Marital status: Married    Spouse name: Lauren  . Number of children: 0  . Years of education: 72  . Highest education level: Not on file  Occupational History  . 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 Financial resource strain: Not on file  . Food insecurity    Worry: Not on file    Inability: Not on file  . Transportation needs    Medical: Not on file    Non-medical: Not on file  Tobacco Use  . Smoking status: Current Every Day Smoker    Packs/day: 0.50    Types: Cigarettes  . Smokeless tobacco: Never Used  Substance and Sexual Activity  . Alcohol use: Yes    Comment: 11/26/16 2 weekly  . Drug use: No  . Sexual activity: Not on file  Lifestyle  . Physical  activity    Days per week: Not on file    Minutes per session: Not on file  . Stress: Not on file  Relationships  . Social Herbalist on phone: Not on file    Gets together: Not on file    Attends religious service: Not on file    Active member of club or organization: Not on file    Attends meetings of clubs or organizations: Not on file    Relationship status: Not on file  . Intimate partner violence    Fear of current or ex partner: Not on file    Emotionally abused: Not on file    Physically abused: Not on file    Forced sexual activity: Not on file  Other Topics Concern  . Not on file  Social History Narrative  . Not on file    Vital signs in last 24 hours: '@VSRANGES' @   General:   Alert, in NAD. No scleral icterus. No bilateral temporal wasting.  Heart:  Regular rate and rhythm; no murmurs Pulm: Clear anteriorly;  no wheezing Abdomen:  Soft. Nontender. Nondistended. Normal bowel sounds. No rebound or guarding. No fluid wave.  LAD: No inguinal or umbilical LAD Extremities:  Without edema. Neurologic:  Alert and  oriented x4;  grossly normal neurologically; no asterixis or clonus. Skin: No jaundice. No palmar erythema or spider angioma.  Psych:  Alert and cooperative. Normal mood and affect.   Braxdon Gappa L. Tarri Glenn, MD, MPH [facility] 03/01/2019, 9:12 AM

## 2019-03-01 NOTE — Patient Instructions (Addendum)
Your brothers and sisters should be screened for colon cancer given your precancerous poyps.   I have recommended some additional labs to evaluate your elevated liver enzymes (AST and ALT).   Abstain from all alcohol including beer, wine, liquor, and non-alcoholic beer.  Consider drinking 2-3 9 ounce cups of regular brewed coffee every day. Although limit your daily caffeine intake to no more than 400 mg. Love your liver!  Improve sleep hygiene to match sleep and wake times during workdays and weekends.  Sleep at least 7-9 hours every night. Use blackout curtains, turn off lights at bedtime, and limit nighttime use of electronic devices and caffeine.   Let's plan to follow-up in 3 months or earlier as needed.   Fiber Chart  You should be consuming 25-30g of fiber per day and drinking 8 glasses of water to help your bowels move regularly.  In the chart below you can look up how much fiber you are getting in an average day.  If you are not getting enough fiber, you should add a fiber supplement to your diet.  Examples of this include Metamucil, FiberCon and Citrucel.  These can be purchased at your local grocery store or pharmacy.      [email or web address]

## 2019-03-07 LAB — ANTI-SMOOTH MUSCLE ANTIBODY, IGG: Actin (Smooth Muscle) Antibody (IGG): <20 U (ref <20)

## 2019-03-07 LAB — IGM: IgM, Serum: 59 mg/dL (ref 50–300)

## 2019-03-07 LAB — HEPATITIS B CORE ANTIBODY, TOTAL: Hep B Core Total Ab: NONREACTIVE

## 2019-03-07 LAB — IGG: IgG (Immunoglobin G), Serum: 972 mg/dL (ref 600–1640)

## 2019-03-07 LAB — ANA: Anti Nuclear Antibody (ANA): POSITIVE — AB

## 2019-03-07 LAB — ANTI-NUCLEAR AB-TITER (ANA TITER): ANA Titer 1: 1:40 {titer} — ABNORMAL HIGH

## 2019-03-07 LAB — MITOCHONDRIAL ANTIBODIES: Mitochondrial M2 Ab, IgG: <=20 U

## 2019-03-07 LAB — CERULOPLASMIN: Ceruloplasmin: 34 mg/dL (ref 18–36)

## 2019-03-07 LAB — HEPATITIS C ANTIBODY
Hepatitis C Ab: NONREACTIVE
SIGNAL TO CUT-OFF: 0 (ref <1.00)

## 2019-03-07 LAB — HEPATITIS B SURFACE ANTIGEN: Hepatitis B Surface Ag: NONREACTIVE

## 2019-03-09 LAB — INSULIN, FREE AND TOTAL
Free Insulin: 10 uU/mL
Total Insulin: 10 uU/mL

## 2019-03-30 ENCOUNTER — Other Ambulatory Visit: Payer: Self-pay

## 2019-03-30 ENCOUNTER — Ambulatory Visit (HOSPITAL_COMMUNITY)
Admission: EM | Admit: 2019-03-30 | Discharge: 2019-03-30 | Disposition: A | Discharge status: Home / Self Care | Payer: 59 | Attending: Family Medicine | Admitting: Family Medicine

## 2019-03-30 ENCOUNTER — Encounter (HOSPITAL_COMMUNITY): Payer: Self-pay

## 2019-03-30 DIAGNOSIS — R110 Nausea: Secondary | ICD-10-CM

## 2019-03-30 DIAGNOSIS — R1012 Left upper quadrant pain: Secondary | ICD-10-CM | POA: Insufficient documentation

## 2019-03-30 LAB — CBC WITH DIFFERENTIAL/PLATELET
Abs Immature Granulocytes: 0.05 10*3/uL (ref 0.00–0.07)
Basophils Absolute: 0 10*3/uL (ref 0.0–0.1)
Basophils Relative: 0 %
Eosinophils Absolute: 0.1 10*3/uL (ref 0.0–0.5)
Eosinophils Relative: 1 %
HCT: 47.4 % (ref 39.0–52.0)
Hemoglobin: 16 g/dL (ref 13.0–17.0)
Immature Granulocytes: 1 %
Lymphocytes Relative: 28 %
Lymphs Abs: 2.7 10*3/uL (ref 0.7–4.0)
MCH: 29.7 pg (ref 26.0–34.0)
MCHC: 33.8 g/dL (ref 30.0–36.0)
MCV: 88.1 fL (ref 80.0–100.0)
Monocytes Absolute: 0.7 10*3/uL (ref 0.1–1.0)
Monocytes Relative: 7 %
Neutro Abs: 6.3 10*3/uL (ref 1.7–7.7)
Neutrophils Relative %: 63 %
Platelets: 269 10*3/uL (ref 150–400)
RBC: 5.38 MIL/uL (ref 4.22–5.81)
RDW: 11.9 % (ref 11.5–15.5)
WBC: 9.9 10*3/uL (ref 4.0–10.5)
nRBC: 0 % (ref 0.0–0.2)

## 2019-03-30 MED ORDER — ONDANSETRON HCL 4 MG PO TABS: 4.0000 mg | ORAL_TABLET | Freq: Four times a day (QID) | ORAL | 0 refills | Status: DC | PRN

## 2019-03-30 NOTE — ED Triage Notes (Signed)
Pt reports he is having abdominal pain, headache and nausea x 5 days. Pt tested negative for Covid today.

## 2019-03-30 NOTE — Discharge Instructions (Signed)
Take the zofran as needed for nausea Advance your diet to eat more solid food Continue bland diet - no fatty or highly spiced foods See your doctor next week Call tomorrow if you have not heard about your blood test result

## 2019-03-30 NOTE — ED Provider Notes (Signed)
[place]    CSN: [id number] Arrival date & time: 03/30/19  1631      History   Chief Complaint Chief Complaint  Patient presents with  . Abdominal Cramping  . Headache  . Nausea    HPI James Andrade is a 28 y.o. male.   HPI  Patient is here for headache, nausea, abdominal pain.  He has been having the symptoms for 5 days.  Because of his symptoms he went to get a Covid test on Wednesday.  He found out today that his test was negative.  He is here to find out why he is having abdominal pain.  Its mostly left upper quadrant.  Its is sharp cramp.  It happens intermittently.  Happens all day long.  Regardless of meals.  He has had nausea for the most of the last 5 days.  He has had some soup and he is drinking liquids.  He is not eating or drinking very much.  He has not had any fever or chills.  No body aches.  No loss of sense of test or smell.  Normal bowel movements.  No history of abdominal problems except for mildly elevated liver functions, and colon polyps Is under the care of gastroenterology for his colon polyps and increased liver function test.  He had a bunch of blood work done.  He had a positive ANA.  He has an appointment with his PCP next week to discuss. No ulcers, gastritis, GERD.  He does have more belching  Past Medical History:  Diagnosis Date  . Asthma    childhood  . Blood transfusion without reported diagnosis 2010   thinks after head injury  . Depression    states after head injury, not on meds  . Hyperlipidemia    not on meds  . Seizures ([place]) 2010   did after brain trauma    Patient Active Problem List   Diagnosis Date Noted  . SINUS BRADYCARDIA 03/24/2010  . WRIST PAIN, LEFT 09/18/2008  . ANKLE PAIN 09/18/2008  . FRACTURE, MEDIAL MALLEOLUS 04/17/2007    Past Surgical History:  Procedure Laterality Date  . BRAIN SURGERY  2010   hematoma from fall onto back of head, Dr Sherwood Gambler  . WISDOM TOOTH EXTRACTION  2012        Home Medications    Prior to Admission medications   Medication Sig Start Date End Date Taking? Authorizing Provider  Multiple Vitamin (MULTIVITAMIN) tablet Take 1 tablet by mouth daily.   Yes [provider]  ondansetron (ZOFRAN) 4 MG tablet Take 1-2 tablets (4-8 mg total) by mouth every 6 (six) hours as needed for nausea or vomiting. 03/30/19   Raylene Everts, MD    Family History History reviewed. No pertinent family history.  Social History Social History   Tobacco Use  . Smoking status: Current Every Day Smoker    Packs/day: 0.50    Types: Cigarettes  . Smokeless tobacco: Never Used  Substance Use Topics  . Alcohol use: Yes    Comment: 11/26/16 2 weekly  . Drug use: No     Allergies   Penicillins, Sulfa antibiotics, and Cephalexin   Review of Systems Review of Systems  Constitutional: Negative for chills and fever.  HENT: Negative for ear pain and sore throat.   Eyes: Negative for pain and visual disturbance.  Respiratory: Negative for cough and shortness of breath.   Cardiovascular: Negative for chest pain and palpitations.  Gastrointestinal: Positive for abdominal pain  and nausea. Negative for vomiting.  Genitourinary: Negative for dysuria and hematuria.  Musculoskeletal: Negative for arthralgias and back pain.  Skin: Negative for color change and rash.  Neurological: Positive for headaches. Negative for seizures and syncope.  All other systems reviewed and are negative.    Physical Exam Triage Vital Signs ED Triage Vitals  Enc Vitals Group     BP 03/30/19 1701 132/81     Pulse Rate 03/30/19 1701 92     Resp 03/30/19 1701 16     Temp 03/30/19 1701 99.1 F (37.3 C)     Temp Source 03/30/19 1701 Temporal     SpO2 03/30/19 1701 96 %     Weight --      Height --      Head Circumference --      Peak Flow --      Pain Score 03/30/19 1659 7     Pain Loc --      Pain Edu? --      Excl. in [place]? --    No data found.  Updated Vital Signs  BP 132/81 (BP Location: Left Arm)   Pulse 92   Temp 99.1 F (37.3 C) (Temporal)   Resp 16   SpO2 96%       Physical Exam Constitutional:      General: He is not in acute distress.    Appearance: He is well-developed.  HENT:     Head: Normocephalic and atraumatic.  Eyes:     Conjunctiva/sclera: Conjunctivae normal.     Pupils: Pupils are equal, round, and reactive to light.  Neck:     Musculoskeletal: Normal range of motion and neck supple.  Cardiovascular:     Rate and Rhythm: Normal rate and regular rhythm.     Heart sounds: Normal heart sounds.  Pulmonary:     Effort: Pulmonary effort is normal. No respiratory distress.     Breath sounds: Normal breath sounds. No rhonchi or rales.  Abdominal:     General: Bowel sounds are normal. There is no distension.     Palpations: Abdomen is soft.     Tenderness: There is no abdominal tenderness.     Comments: ABDOMEN SOFT AND BENIGN  Musculoskeletal: Normal range of motion.  Skin:    General: Skin is warm and dry.  Neurological:     Mental Status: He is alert.  Psychiatric:        Mood and Affect: Mood normal.        Behavior: Behavior normal.      UC Treatments / Results  Labs (all labs ordered are listed, but only abnormal results are displayed) Labs Reviewed  CBC WITH DIFFERENTIAL/PLATELET  COMPREHENSIVE METABOLIC PANEL  LIPASE, BLOOD    EKG   Radiology No results found.  Procedures Procedures (including critical care time)  Medications Ordered in UC Medications - No data to display  Initial Impression / Assessment and Plan / UC Course  I have reviewed the triage vital signs and the nursing notes.  Pertinent labs & imaging results that were available during my care of the patient were reviewed by me and considered in my medical decision making (see chart for details).     Will do labs to help identify what is causing his pain.  The labs are available later tonight or tomorrow.  Final Clinical  Impressions(s) / UC Diagnoses   Final diagnoses:  Left upper quadrant abdominal pain  Nausea without vomiting     Discharge Instructions  Take the zofran as needed for nausea Advance your diet to eat more solid food Continue bland diet - no fatty or highly spiced foods See your doctor next week Call tomorrow if you have not heard about your blood test result   ED Prescriptions    Medication Sig Dispense Auth. Provider   ondansetron (ZOFRAN) 4 MG tablet Take 1-2 tablets (4-8 mg total) by mouth every 6 (six) hours as needed for nausea or vomiting. 20 tablet Raylene Everts, MD     PDMP not reviewed this encounter.   Raylene Everts, MD 03/30/19 1750

## 2019-03-31 ENCOUNTER — Telehealth (HOSPITAL_COMMUNITY): Payer: Self-pay | Admitting: Emergency Medicine

## 2019-03-31 NOTE — Telephone Encounter (Signed)
Late entry.    11:30 am received message to call lab.  Leslie in lab [phone]) reported blood tube lost/unable to find.    Notified dr Rosiland Oz.  Dr Joseph Art reviewed chart and said there was no need to redraw blood.

## 2019-04-02 ENCOUNTER — Encounter: Payer: Self-pay | Admitting: *Deleted

## 2019-04-02 ENCOUNTER — Telehealth: Payer: Self-pay | Admitting: Gastroenterology

## 2019-04-02 ENCOUNTER — Other Ambulatory Visit: Payer: Self-pay | Admitting: *Deleted

## 2019-04-02 DIAGNOSIS — R1032 Left lower quadrant pain: Secondary | ICD-10-CM

## 2019-04-02 MED ORDER — DICYCLOMINE HCL 20 MG PO TABS: 20.0000 mg | ORAL_TABLET | Freq: Four times a day (QID) | ORAL | 1 refills | Status: DC | PRN

## 2019-04-02 NOTE — Telephone Encounter (Signed)
Spoke to the patient who went to Urgent Care on 10/16 with the same abd pain. The patient reports is LLQ pain (not LUQ as noted by Urgent Care MD) that "comes in waves." Sharp, cramping, he describes the pain as slowly increasing to an intense pain then the pain declines to a dull constant ache. He reports these attacks happen 4-5 times daily since 10/11. He also reports headaches have been accompanying his pain. He reports constant, persistent nausea (no vomiting), he was prescribed Zofran at [facility], the patient states this has helped with his nausea. Endorses normal BM's, no diarrhea. Please advise.

## 2019-04-02 NOTE — Telephone Encounter (Signed)
Pt reported that he has been having LUQ pain and nausea.  Please advise.

## 2019-04-02 NOTE — Telephone Encounter (Signed)
I am sorry that he is having so much pain. Please arrange for a HIDA scan with [facility] to assess gallbladder function. He may try dicyclomine 20 mg QID prn. Schedule follow-up with me or APP. Thank you.

## 2019-04-02 NOTE — Telephone Encounter (Signed)
The patient has been scheduled for a HIDA scan on 11/2 at 10 am. Patient aware, MyChart message sent to patient and patient called.   Dicyclomine script sent to pharmacy. Patient aware.  F/U scheduled on 11/12 at 9:50 am.

## 2019-04-05 ENCOUNTER — Encounter: Payer: Self-pay | Admitting: Gastroenterology

## 2019-04-09 ENCOUNTER — Telehealth: Payer: Self-pay | Admitting: Gastroenterology

## 2019-04-09 ENCOUNTER — Encounter: Payer: Self-pay | Admitting: *Deleted

## 2019-04-09 NOTE — Telephone Encounter (Signed)
Called the patient who did not ask results of HIDA scan as the HIDA scan has not been completed yet. He wanted to know if Dr. Tarri Glenn saw his abd [place] results and if she had any comments concerning his normal scan. The patient was reminded to come in on 11/2 for his HIDA scan and she would discuss those results during his visit on 11/12.

## 2019-04-09 NOTE — Telephone Encounter (Signed)
I have not received any recent abdominal ultrasound. Reviewed 2018 results at time of last office visit. If he has had recent imaging I am unable to locate it in EPIC or in [place]. Please obtain a copy of any new results. I will let him know as soon as I receive the HIDA results. Thank you.

## 2019-04-09 NOTE — Telephone Encounter (Signed)
Pt would like to know if Dr. Tarri Glenn has any comments on results of HIDA scan.  He is aware of appt on 04/26/19.

## 2019-04-09 NOTE — Telephone Encounter (Signed)
Left message for patient to call back  

## 2019-04-10 ENCOUNTER — Encounter: Payer: Self-pay | Admitting: *Deleted

## 2019-04-10 NOTE — Telephone Encounter (Signed)
Please let the patient know that I reviewed his ultrasound from 04/05/19. It shows a normal liver. There are no gallstones but a small amount of sludge in the gallbladder. No other findings. I have no new recommendations at this time. Thanks.    Told the patient Dr. Tarri Glenn above findings. Patient verbalized understanding. Reviewed HIDA scan appointment. Nothing further at the time of the call.

## 2019-04-16 ENCOUNTER — Encounter (HOSPITAL_COMMUNITY)
Admission: RE | Admit: 2019-04-16 | Discharge: 2019-04-16 | Disposition: A | Discharge status: Home / Self Care | Payer: 59 | Source: Ambulatory Visit | Attending: Gastroenterology | Admitting: Gastroenterology

## 2019-04-16 ENCOUNTER — Other Ambulatory Visit: Payer: Self-pay

## 2019-04-16 DIAGNOSIS — R1032 Left lower quadrant pain: Secondary | ICD-10-CM | POA: Diagnosis not present

## 2019-04-16 MED ORDER — TECHNETIUM TC 99M MEBROFENIN IV KIT
5.5000 | PACK | Freq: Once | INTRAVENOUS | Status: AC
  Administered 2019-04-16: 5.5 via INTRAVENOUS

## 2019-04-26 ENCOUNTER — Encounter: Payer: Self-pay | Admitting: Gastroenterology

## 2019-04-26 ENCOUNTER — Ambulatory Visit: Payer: 59 | Admitting: Gastroenterology

## 2019-04-26 VITALS — BP 110/68 | HR 64 | Temp 98.1°F | Ht 70.0 in | Wt 214.4 lb

## 2019-04-26 DIAGNOSIS — K625 Hemorrhage of anus and rectum: Secondary | ICD-10-CM | POA: Diagnosis not present

## 2019-04-26 DIAGNOSIS — R1012 Left upper quadrant pain: Secondary | ICD-10-CM | POA: Diagnosis not present

## 2019-04-26 DIAGNOSIS — R748 Abnormal levels of other serum enzymes: Secondary | ICD-10-CM

## 2019-04-26 DIAGNOSIS — Z1159 Encounter for screening for other viral diseases: Secondary | ICD-10-CM | POA: Diagnosis not present

## 2019-04-26 MED ORDER — DICYCLOMINE HCL 20 MG PO TABS: 20.0000 mg | ORAL_TABLET | Freq: Four times a day (QID) | ORAL | 1 refills | Status: DC | PRN

## 2019-04-26 MED ORDER — PANTOPRAZOLE SODIUM 40 MG PO TBEC: 40.0000 mg | DELAYED_RELEASE_TABLET | Freq: Two times a day (BID) | ORAL | 3 refills | Status: DC

## 2019-04-26 NOTE — Progress Notes (Signed)
Referring Provider: Deland Pretty, MD Primary Care Physician:  Deland Pretty, MD   Chief complaint: Abdominal pain and abnormal liver enzymes  IMPRESSION:  Left upper quadrant pain with associated nausea    - abdominal ultrasound shows gallbladder sludge   - HIDA scan was normal except for pain with Ensure ingestion Anal fissure    - intolerant to nitroglycerin due to headaches     - responding to nifedipine 0.03% with 5% lidocaine Abnormal transaminases    - labs 12/26/18: AST 55,  ALT 118      - abdominal ultrasound normal in 2018 and 2020    - HOMA-IR 2.1    - ANA + 1:40 without other markers for autoimmune hepatitis    - testing negative for HCV, HBV, hemochromatosis, autoimmune hepatitis Vitamin D deficiency Personal history of colon polyps    - 3 tubular adenomas, one sessile serrated adenoma on colonoscopy 01/19/19    - surveillance colonoscopy recommended in 2023  Etiology of LUQ pain and nausea is unclear.  Symptomatic gallbladder disease is possible given the sludge on ultrasound and the symptoms induced with drinking Ensure at the time of his HIDA scan.  However, the differential also includes erosive esophagitis, H. pylori, gastritis, gastroparesis, peptic ulcer disease, and symptomatic gallbladder disease.  Despite his abnormal liver enzymes, his presentation is atypical for sphincter of Oddi dysfunction.  Trial of PPI therapy recommended. EGD if not improving in 2-3 weeks. Referral to surgery to consider cholecystectomy.   Reviewed his recent transaminases.  Abdominal ultrasound and liver enzymes were reviewed with the patient today.  Although his ANA was elevated it was mildly elevated and not expected to be related to autoimmune hepatitis given his other lab results.  Homa IR was 2.1.  I do not believe his liver enzymes are elevated enough to support a liver biopsy.  However I would recommend proceeding with a fibrosure for further evaluation.  Would consider a liver biopsy  performed at the time of cholecystectomy if this is considered by surgery.   Mr. James Andrade has a positive ANA.  It is weakly positive.  It does not support a diagnosis of autoimmune hepatitis.  The clinical implications are unclear.  He is concerned about the clinical implications and I have asked him to follow-up with Dr. Concha Pyo.  Continue treatment for anal fissure including high-fiber diet, drinking plenty of water, using a daily psyllium stool bulking agent, and nitroglycerin ointment as needed given his intermittent rectal bleeding.  Given his history of colon polyps on recent colonoscopy he should have a surveillance colonoscopy in 3 years.      PLAN: Pantoprazole 40 mg BID x 8 weeks, use famotidine for breakthrough pain Continue Bentyl 20 mg QID (6 month refill provided today) EGD if no improvement in 2-3 weeks Continue high fiber diet recommended, drink at least 1.5-2 liters of water Continue daily stool bulking agent with psyllium or methylcellulose Colonoscopy in 2023 [facility] if the patient does not end up having a cholecystectomy with a liver biopsy Referral to [facility] to consider cholecystectomy Follow-up with Dr. Shelia Media given the elevated ANA  The nature of the procedure, as well as the risks, benefits, and alternatives were carefully and thoroughly reviewed with the patient. Ample time for discussion and questions allowed. The patient understood, was satisfied, and agreed to proceed.  Please see the "Patient Instructions" section for addition details about the plan.  HPI: James Andrade is a 28 y.o. male originally seen in consultation in  March 2020. The interval history is obtained through the patient. His wife is a chemotherapy nurse at [employer].  He had a colonoscopy 01/19/2019 for blood in the stool that revealed a posterior anal fissure, 4 small polyps in the sigmoid colon, ascending colon, and cecum.  3 of the polyps were adenomas and the third was  sessile serrated polyp.  The exam was otherwise normal.  Surveillance colonoscopy recommended in 3 years.    This summer, Dr. Shelia Media sent over the following labs obtained on routine annual evaluation.  The patient reported a history of liver enzymes in the past.  He had a normal ultrasound in 2018.  Was previously told this could be due to cholesterol.  No associated symptoms. Blood donation in high school.  No prior blood transfusion.  No history of jaundice or scleral icterus.  No history of use or experimentation with IV or intranasal street drugs.  No history of autoimmune disease.  No family history of liver disease. He is now on a low carb diet. Using fiber supplements, fish oil, and red yeast.    Since his last visit in September I have been able to obtain some additional prior liver enzymes: 12/27/2016: Total bilirubin 0.3, AST 30, ALT 50, alk phos 75 04/12/2017: Total bilirubin 0.8, AST 19, ALT 22, alk phos 68 12/20/2017: Total bilirubin 0.7, AST 33, ALT 51, alk phos 78 12/26/18: WBC 10.2, hgb 16.6, platelets 250, RDW 12.4, MCV 87.7, AST 55, ALT 115, alk phos 84, alb 4.9, TB 0.7, crt 0.97, Na 139, Lipids abnormal (cholesterol 329, HDL 37, LDL 239, triglycerides 267), TSH 4.11, UA negative for bilirubin, Vit D low at 23.8 Labs 03/01/2019: Iron 142, ferritin 81, ceruloplasmin 34, IgG 972, IgM 59, glucose 87, ANA positive at 1-40, F-actin less than 20, total insulin 1hep B surface antigen negative, hep B core total antibody nonreactive, hep C antibody nonreactive  He called the office in October reporting left upper quadrant pain and nausea.   Awoke on October 12th with a headache and his stomach in knots. Associated nausea. LUQ pain.  Daily nausea persists. Intermittent waves of non-radiating, LUQ pain lasting from 5 minutes to many hours. Pain has not woken him from sleep. However, the pain has disrupted his sleep. Unable to eat any greasy or fatty foods. Eating soup. Post-prandial diarrhea. +  Eructation. Stools are now mucous. Had one day where the pain persisted for over 24 hours. No change with defecation or movement. Walking during the episodes may provide some relief.   Wife notes poor appetite. He complains about his stomach burning. Feels like he should throw up but he can't. Developed heart burn.   Taking the Bentyl provided relief initially, but, not as much now.  However he is reduce the frequency so that he did not run out of his prescription.  No other associated symptoms. No identified exacerbating or relieving features.   Some pain and bleeding related to the increased in bowel movements.    Further evaluation with an ultrasound and HIDA scan was arranged.  A trial of dicyclomine 20 mg 4 times daily was prescribed. Abdominal ultrasound 04/05/19: gallbladder sludge; normal liver HIDA scan 04/03/2019: Patent biliary tree with normal gallbladder ejection fraction of 47%.  Abdominal pain occurred following Ensure ingestion.  No marijuana or alcohol.  No NSAIDs.  Prior abdominal imaging: Abdominal ultrasound 01/19/17: normal Abdominal ultrasound 04/05/19: gallbladder sludge; normal liver HIDA scan 04/03/2019: Patent biliary tree with normal gallbladder ejection fraction of 47%.  Abdominal pain  occurred following Ensure ingestion.  Endoscopic history: 01/19/19: posterior anal fissure, 3 small tubular adenomas and 1 sessile serrated polyp  Stepfather with similar symptoms. Maternal grandmother required cholecystectomy.  Past Medical History:  Diagnosis Date  . Asthma    childhood  . Blood transfusion without reported diagnosis 2010   thinks after head injury  . Depression    states after head injury, not on meds  . Hyperlipidemia    not on meds  . Seizures ([place]) 2010   did after brain trauma    Past Surgical History:  Procedure Laterality Date  . BRAIN SURGERY  2010   hematoma from fall onto back of head, Dr Sherwood Gambler  . WISDOM TOOTH EXTRACTION  2012     Current Outpatient Medications  Medication Sig Dispense Refill  . dicyclomine (BENTYL) 20 MG tablet Take 1 tablet (20 mg total) by mouth 4 (four) times daily as needed for spasms. 30 tablet 1  . Multiple Vitamin (MULTIVITAMIN) tablet Take 1 tablet by mouth daily.    . ondansetron (ZOFRAN) 4 MG tablet Take 1-2 tablets (4-8 mg total) by mouth every 6 (six) hours as needed for nausea or vomiting. 20 tablet 0   No current facility-administered medications for this visit.     Allergies as of 04/26/2019 - Review Complete 03/30/2019  Allergen Reaction Noted  . Penicillins Swelling   . Sulfa antibiotics Hives 08/21/2018  . Cephalexin Swelling     No family history on file.  Social History   Socioeconomic History  . Marital status: Married    Spouse name: Lauren  . Number of children: 0  . Years of education: 60  . Highest education level: Not on file  Occupational History  . 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 Financial resource strain: Not on file  . Food insecurity    Worry: Not on file    Inability: Not on file  . Transportation needs    Medical: Not on file    Non-medical: Not on file  Tobacco Use  . Smoking status: Current Every Day Smoker    Packs/day: 0.50    Types: Cigarettes  . Smokeless tobacco: Never Used  Substance and Sexual Activity  . Alcohol use: Yes    Comment: 11/26/16 2 weekly  . Drug use: No  . Sexual activity: Not on file  Lifestyle  . Physical activity    Days per week: Not on file    Minutes per session: Not on file  . Stress: Not on file  Relationships  . Social Herbalist on phone: Not on file    Gets together: Not on file    Attends religious service: Not on file    Active member of club or organization: Not on file    Attends meetings of clubs or organizations: Not on file    Relationship status: Not on file  . Intimate partner violence    Fear of current or ex partner: Not on file     Emotionally abused: Not on file    Physically abused: Not on file    Forced sexual activity: Not on file  Other Topics Concern  . Not on file  Social History Narrative  . Not on file     Physical Exam: General:   Alert, in NAD. No scleral icterus. No bilateral temporal wasting.  Heart:  Regular rate and rhythm; no murmurs Pulm: Clear anteriorly; no wheezing Abdomen:  Soft.  Nontender.  I am unable to reproduce his pain.  Nondistended. Normal bowel sounds. No rebound or guarding. No fluid wave.  LAD: No inguinal or umbilical LAD Extremities:  Without edema. Neurologic:  Alert and  oriented x4;  grossly normal neurologically; no asterixis or clonus. Skin: No jaundice. No palmar erythema or spider angioma.  Psych:  Alert and cooperative. Normal mood and affect.   Novalyn Lajara L. Tarri Glenn, MD, MPH [facility] 04/26/2019, 8:38 AM

## 2019-04-26 NOTE — Patient Instructions (Addendum)
You have been scheduled for an endoscopy. Please follow written instructions given to you at your visit today. If you use inhalers (even only as needed), please bring them with you on the day of your procedure.  We have sent the following medications to your pharmacy for you to pick up at your convenience: Pantoprazole 40 mg twice a day  Continue Bentyl as needed.   Continue High fiber diet, drink 1.5-2 liters of water daily. Continue daily stool bulking agent.   You will be due for a recall colonoscopy in 2023. We will send you a reminder in the mail when it gets closer to that time.  We will refer you to [facility] [place] [facility] for a consultation.   Please follow up with Dr.Pharr due to your elevated ANA.

## 2019-05-08 ENCOUNTER — Encounter: Payer: Self-pay | Admitting: Gastroenterology

## 2019-05-15 ENCOUNTER — Other Ambulatory Visit: Payer: Self-pay | Admitting: Gastroenterology

## 2019-05-15 ENCOUNTER — Ambulatory Visit (INDEPENDENT_AMBULATORY_CARE_PROVIDER_SITE_OTHER): Payer: 59

## 2019-05-15 DIAGNOSIS — Z1159 Encounter for screening for other viral diseases: Secondary | ICD-10-CM

## 2019-05-16 ENCOUNTER — Telehealth: Payer: Self-pay | Admitting: *Deleted

## 2019-05-16 ENCOUNTER — Encounter: Payer: Self-pay | Admitting: *Deleted

## 2019-05-16 LAB — SARS CORONAVIRUS 2 (TAT 6-24 HRS): SARS Coronavirus 2: NEGATIVE

## 2019-05-16 NOTE — Telephone Encounter (Signed)
Followed up with [facility]. The patient has been scheduled for a consultation on 12/8 at 11:30 am.

## 2019-05-17 ENCOUNTER — Other Ambulatory Visit: Payer: Self-pay

## 2019-05-17 ENCOUNTER — Encounter: Payer: Self-pay | Admitting: Gastroenterology

## 2019-05-17 ENCOUNTER — Ambulatory Visit (AMBULATORY_SURGERY_CENTER): Payer: 59 | Admitting: Gastroenterology

## 2019-05-17 VITALS — BP 120/82 | HR 60 | Temp 97.8°F | Resp 16 | Ht 70.0 in | Wt 214.0 lb

## 2019-05-17 DIAGNOSIS — K3189 Other diseases of stomach and duodenum: Secondary | ICD-10-CM

## 2019-05-17 DIAGNOSIS — K2950 Unspecified chronic gastritis without bleeding: Secondary | ICD-10-CM

## 2019-05-17 DIAGNOSIS — R1012 Left upper quadrant pain: Secondary | ICD-10-CM

## 2019-05-17 HISTORY — PX: UPPER GASTROINTESTINAL ENDOSCOPY: SHX188

## 2019-05-17 MED ORDER — FAMOTIDINE 20 MG PO TABS: 20.0000 mg | ORAL_TABLET | Freq: Two times a day (BID) | ORAL | 0 refills | Status: DC

## 2019-05-17 MED ORDER — SODIUM CHLORIDE 0.9 % IV SOLN: 500.0000 mL | Freq: Once | INTRAVENOUS | Status: DC

## 2019-05-17 NOTE — Op Note (Signed)
[facility] Patient Name: James Andrade Procedure Date: 05/17/2019 10:08 AM MRN: [id number] Endoscopist: Thornton Park MD, MD Age: 28 Referring MD:  Date of Birth: 03/16/1991 Gender: Male Account #: [id number] Procedure:                Upper GI endoscopy Indications:              Abdominal pain in the left upper quadrant, Nausea Medicines:                Monitored Anesthesia Care Procedure:                Pre-Anesthesia Assessment:                           - Prior to the procedure, a History and Physical                            was performed, and patient medications and                            allergies were reviewed. The patient's tolerance of                            previous anesthesia was also reviewed. The risks                            and benefits of the procedure and the sedation                            options and risks were discussed with the patient.                            All questions were answered, and informed consent                            was obtained. Prior Anticoagulants: The patient has                            taken no previous anticoagulant or antiplatelet                            agents. ASA Grade Assessment: II - A patient with                            mild systemic disease. After reviewing the risks                            and benefits, the patient was deemed in                            satisfactory condition to undergo the procedure.                           After obtaining informed consent, the endoscope was  passed under direct vision. Throughout the                            procedure, the patient's blood pressure, pulse, and                            oxygen saturations were monitored continuously. The                            Endoscope was introduced through the mouth, and                            advanced to the third part of duodenum. The upper     GI endoscopy was accomplished without difficulty.                            The patient tolerated the procedure well. Scope In: Scope Out: Findings:                 The Z-line was slightly irregular. Biopsies were                            obtained from the proximal and distal esophagus                            with cold forceps for histology of suspected                            eosinophilic esophagitis. Estimated blood loss was                            minimal.                           Diffuse mildly erythematous mucosa without bleeding                            was found in the gastric body. Biopsies were taken                            with a cold forceps for histology. Estimated blood                            loss was minimal.                           The examined duodenum was normal. Biopsies were                            taken with a cold forceps for histology. Estimated                            blood loss was minimal.  The cardia and gastric fundus were normal on                            retroflexion.                           The exam was otherwise without abnormality. Complications:            No immediate complications. Estimated blood loss:                            Minimal. Estimated Blood Loss:     Estimated blood loss was minimal. Impression:               - Z-line irregular. Biopsied.                           - Erythematous mucosa in the gastric body. Biopsied.                           - Normal examined duodenum. Biopsied.                           - The examination was otherwise normal. Recommendation:           - Patient has a contact number available for                            emergencies. The signs and symptoms of potential                            delayed complications were discussed with the                            patient. Return to normal activities tomorrow.                            Written discharge  instructions were provided to the                            patient.                           - Resume previous diet.                           - Continue present medications. Continue                            pantoprazole 40 mg BID. Add famotidine 20 mg BID.                           - Await pathology results.                           - Follow-up in the office in 4-8 weeks, or earlier  as necessary.                           - Surgical consultation next week as previously                            planned. Thornton Park MD, MD 05/17/2019 10:34:25 AM This report has been signed electronically.

## 2019-05-17 NOTE — Progress Notes (Signed)
Called to room to assist during endoscopic procedure.  Patient ID and intended procedure confirmed with present staff. Received instructions for my participation in the procedure from the performing physician.  

## 2019-05-17 NOTE — Progress Notes (Signed)
Temp check by:LC Vital check by:CW  The medical and surgical history was reviewed and verified with the patient.

## 2019-05-17 NOTE — Progress Notes (Signed)
A/ox3, pleased with MAC, report to RN 

## 2019-05-17 NOTE — Patient Instructions (Signed)
YOU HAD AN ENDOSCOPIC PROCEDURE TODAY AT [facility]:   Refer to the procedure report that was given to you for any specific questions about what was found during the examination.  If the procedure report does not answer your questions, please call your gastroenterologist to clarify.  If you requested that your care partner not be given the details of your procedure findings, then the procedure report has been included in a sealed envelope for you to review at your convenience later.  YOU SHOULD EXPECT: Some feelings of bloating in the abdomen. Passage of more gas than usual.  Walking can help get rid of the air that was put into your GI tract during the procedure and reduce the bloating.   Please Note:  You might notice some irritation and congestion in your nose or some drainage.  This is from the oxygen used during your procedure.  There is no need for concern and it should clear up in a day or so.  SYMPTOMS TO REPORT IMMEDIATELY:   Following upper endoscopy (EGD)  Vomiting of blood or coffee ground material  New chest pain or pain under the shoulder blades  Painful or persistently difficult swallowing  New shortness of breath  Fever of 100F or higher  Black, tarry-looking stools  For urgent or emergent issues, a gastroenterologist can be reached at any hour by calling [phone].   DIET:  We do recommend a small meal at first, but then you may proceed to your regular diet.  Drink plenty of fluids but you should avoid alcoholic beverages for 24 hours.  ACTIVITY:  You should plan to take it easy for the rest of today and you should NOT DRIVE or use heavy machinery until tomorrow (because of the sedation medicines used during the test).    FOLLOW UP: Our staff will call the number listed on your records 48-72 hours following your procedure to check on you and address any questions or concerns that you may have regarding the information given to you following your  procedure. If we do not reach you, we will leave a message.  We will attempt to reach you two times.  During this call, we will ask if you have developed any symptoms of COVID 19. If you develop any symptoms (ie: fever, flu-like symptoms, shortness of breath, cough etc.) before then, please call [phone].  If you test positive for Covid 19 in the 2 weeks post procedure, please call and report this information to [place].    If any biopsies were taken you will be contacted by phone or by letter within the next 1-3 weeks.  Please call us at [phone] if you have not heard about the biopsies in 3 weeks.   SIGNATURES/CONFIDENTIALITY: You and/or your care partner have signed paperwork which will be entered into your electronic medical record.  These signatures attest to the fact that that the information above on your After Visit Summary has been reviewed and is understood.  Full responsibility of the confidentiality of this discharge information lies with you and/or your care-partner.  Await pathology  Continue your present medications.  Continue Pantoprazole 40 mg twice daily.  Add Famotidine 20 mg twice daily- I sent a prescription in to your pharmacy.   Follow up with Dr. Tarri Glenn in the office- her nurse will call you to set up this appointment  Follow up with surgical consult as planned

## 2019-05-18 ENCOUNTER — Encounter: Payer: Self-pay | Admitting: *Deleted

## 2019-05-21 ENCOUNTER — Other Ambulatory Visit: Payer: Self-pay

## 2019-05-21 ENCOUNTER — Ambulatory Visit: Payer: 59 | Admitting: Diagnostic Neuroimaging

## 2019-05-21 ENCOUNTER — Encounter: Payer: Self-pay | Admitting: Diagnostic Neuroimaging

## 2019-05-21 ENCOUNTER — Telehealth: Payer: Self-pay

## 2019-05-21 ENCOUNTER — Encounter: Payer: Self-pay | Admitting: *Deleted

## 2019-05-21 VITALS — BP 122/74 | HR 82 | Temp 98.0°F | Ht 70.0 in | Wt 218.0 lb

## 2019-05-21 DIAGNOSIS — G43009 Migraine without aura, not intractable, without status migrainosus: Secondary | ICD-10-CM | POA: Diagnosis not present

## 2019-05-21 MED ORDER — RIZATRIPTAN BENZOATE 10 MG PO TBDP: 10.0000 mg | ORAL_TABLET | ORAL | 11 refills | Status: DC | PRN

## 2019-05-21 MED ORDER — TOPIRAMATE 50 MG PO TABS: 50.0000 mg | ORAL_TABLET | Freq: Two times a day (BID) | ORAL | 12 refills | Status: DC

## 2019-05-21 NOTE — Patient Instructions (Signed)
-   MIGRAINE PREVENTION --> start topiramate 50mg  at bedtime; after 1 week increase to twice a day; drink plenty of water  - MIGRAINE RESCUE --> rizatriptan 10mg  as needed for breakthrough headache; may repeat x 1 after 2 hours; max 2 tabs per day or 8 per month  - visit [email or web address] for sleep hygiene
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
## 2019-05-21 NOTE — Telephone Encounter (Signed)
  Follow up Call-  Call back number 05/17/2019 01/19/2019  Post procedure Call Back phone  # [phone]  Permission to leave phone message Yes Yes  Some recent data might be hidden     Patient questions:  Do you have a fever, pain , or abdominal swelling? No. Pain Score  0 *  Have you tolerated food without any problems? Yes.    Have you been able to return to your normal activities? Yes.    Do you have any questions about your discharge instructions: Diet   No. Medications  No. Follow up visit  No.  Do you have questions or concerns about your Care? No.  Actions: * If pain score is 4 or above: No action needed, pain <4.  1. Have you developed a fever since your procedure? no  2.   Have you had an respiratory symptoms (SOB or cough) since your procedure? no  3.   Have you tested positive for COVID 19 since your procedure no  4.   Have you had any family members/close contacts diagnosed with the COVID 19 since your procedure?  no   If yes to any of these questions please route to Joylene John, RN and Alphonsa Gin, Therapist, sports.

## 2019-05-21 NOTE — Telephone Encounter (Signed)
No answer, left message to call back later today, B.Karan Ramnauth RN. 

## 2019-05-28 ENCOUNTER — Telehealth: Payer: Self-pay | Admitting: Gastroenterology

## 2019-05-28 NOTE — Telephone Encounter (Signed)
Dr. Tarri Glenn, [place] to the patient who stated the [facility] surgeon did not think the issue was his gallbladder. The patient stated the physician led him to believe the issue was his colon so he was "referred back to Dr. Tarri Glenn." The patient is scheduled to see Dr. Tarri Glenn on 06/19/19 at 1:50 pm.   Request for medical records faxed to [facility] (consult office notes for Dr. Tarri Glenn to review). Will await fax and place in Dr. Tarri Glenn box.

## 2019-05-28 NOTE — Telephone Encounter (Signed)
Thank you for the update. Recent colonoscopy was normal. Could proceed with CT abd/pelvis with contrast to further evaluation prior to his office visit if he would like to further investigate the pain. Otherwise, we can wait and discuss options during his upcoming appointment. Thank you.

## 2019-05-29 ENCOUNTER — Other Ambulatory Visit: Payer: Self-pay | Admitting: *Deleted

## 2019-05-29 DIAGNOSIS — R1012 Left upper quadrant pain: Secondary | ICD-10-CM

## 2019-05-29 DIAGNOSIS — R1084 Generalized abdominal pain: Secondary | ICD-10-CM

## 2019-05-29 NOTE — Telephone Encounter (Signed)
Patient scheduled for CT abd/pelvis with contrast on 12/29 at 8 am. Instructions given over the phone, verbalized understanding. Contrast at the front desk for pick up.

## 2019-06-12 ENCOUNTER — Encounter: Payer: Self-pay | Admitting: *Deleted

## 2019-06-12 ENCOUNTER — Ambulatory Visit (HOSPITAL_COMMUNITY)
Admission: RE | Admit: 2019-06-12 | Discharge: 2019-06-12 | Disposition: A | Discharge status: Home / Self Care | Payer: 59 | Source: Ambulatory Visit | Attending: Gastroenterology | Admitting: Gastroenterology

## 2019-06-12 ENCOUNTER — Other Ambulatory Visit: Payer: Self-pay

## 2019-06-12 DIAGNOSIS — R1084 Generalized abdominal pain: Secondary | ICD-10-CM | POA: Insufficient documentation

## 2019-06-12 MED ORDER — SODIUM CHLORIDE (PF) 0.9 % IJ SOLN
INTRAMUSCULAR | Status: AC
  Filled 2019-06-12: qty 50

## 2019-06-12 MED ORDER — IOHEXOL 300 MG/ML  SOLN
100.0000 mL | Freq: Once | INTRAMUSCULAR | Status: AC | PRN
  Administered 2019-06-12: 100 mL via INTRAVENOUS

## 2019-06-19 ENCOUNTER — Encounter: Payer: Self-pay | Admitting: Gastroenterology

## 2019-06-19 ENCOUNTER — Other Ambulatory Visit (INDEPENDENT_AMBULATORY_CARE_PROVIDER_SITE_OTHER): Payer: 59

## 2019-06-19 ENCOUNTER — Ambulatory Visit: Payer: 59 | Admitting: Gastroenterology

## 2019-06-19 VITALS — BP 118/62 | HR 88 | Temp 98.6°F | Ht 70.0 in | Wt 212.2 lb

## 2019-06-19 DIAGNOSIS — R109 Unspecified abdominal pain: Secondary | ICD-10-CM

## 2019-06-19 DIAGNOSIS — R140 Abdominal distension (gaseous): Secondary | ICD-10-CM | POA: Diagnosis not present

## 2019-06-19 DIAGNOSIS — R634 Abnormal weight loss: Secondary | ICD-10-CM

## 2019-06-19 DIAGNOSIS — R748 Abnormal levels of other serum enzymes: Secondary | ICD-10-CM

## 2019-06-19 DIAGNOSIS — R142 Eructation: Secondary | ICD-10-CM

## 2019-06-19 LAB — CBC
HCT: 45.6 % (ref 39.0–52.0)
Hemoglobin: 15.3 g/dL (ref 13.0–17.0)
MCHC: 33.5 g/dL (ref 30.0–36.0)
MCV: 88.6 fl (ref 78.0–100.0)
Platelets: 244 10*3/uL (ref 150.0–400.0)
RBC: 5.15 Mil/uL (ref 4.22–5.81)
RDW: 12.8 % (ref 11.5–15.5)
WBC: 9.4 10*3/uL (ref 4.0–10.5)

## 2019-06-19 LAB — COMPREHENSIVE METABOLIC PANEL
ALT: 32 U/L (ref 0–53)
AST: 20 U/L (ref 0–37)
Albumin: 4.6 g/dL (ref 3.5–5.2)
Alkaline Phosphatase: 75 U/L (ref 39–117)
BUN: 12 mg/dL (ref 6–23)
CO2: 28 mEq/L (ref 19–32)
Calcium: 9.8 mg/dL (ref 8.4–10.5)
Chloride: 104 mEq/L (ref 96–112)
Creatinine, Ser: 0.98 mg/dL (ref 0.40–1.50)
GFR: 92.03 mL/min (ref >60.00)
Glucose, Bld: 101 mg/dL — ABNORMAL HIGH (ref 70–99)
Potassium: 4.3 mEq/L (ref 3.5–5.1)
Sodium: 139 mEq/L (ref 135–145)
Total Bilirubin: 0.6 mg/dL (ref 0.2–1.2)
Total Protein: 7.3 g/dL (ref 6.0–8.3)

## 2019-06-19 MED ORDER — PANTOPRAZOLE SODIUM 40 MG PO TBEC: 40.0000 mg | DELAYED_RELEASE_TABLET | Freq: Two times a day (BID) | ORAL | 3 refills | Status: DC

## 2019-06-19 MED ORDER — DICYCLOMINE HCL 20 MG PO TABS: 20.0000 mg | ORAL_TABLET | Freq: Four times a day (QID) | ORAL | 1 refills | Status: DC | PRN

## 2019-06-19 MED ORDER — RIFAXIMIN 550 MG PO TABS: 550.0000 mg | ORAL_TABLET | Freq: Two times a day (BID) | ORAL | 0 refills | Status: DC

## 2019-06-19 NOTE — Progress Notes (Signed)
 Referring Provider: Pharr, Walter, MD Primary Care Physician:  Andrade, Walter, MD   Chief complaint: Abdominal pain and abnormal liver enzymes  IMPRESSION:  Left upper quadrant pain with associated nausea    - abdominal ultrasound shows gallbladder sludge   - HIDA scan was normal except for pain with  Unintentional weight loss of 18 pounds in 3 months Ensure ingestion Anal fissure    - intolerant to nitroglycerin due to headaches     - responding to nifedipine 0.03% with 5% lidocaine Abnormal transaminases    - labs 12/26/18: AST 55,  ALT 118      - abdominal ultrasound normal in 2018 and 2020    - HOMA-IR 2.1    - ANA + 1:40 without other markers for autoimmune hepatitis    - testing negative for HCV, HBV, hemochromatosis, autoimmune hepatitis Vitamin D deficiency Personal history of colon polyps    - 3 tubular adenomas, one sessile serrated adenoma on colonoscopy 01/19/19    - surveillance colonoscopy recommended in 2023  Etiology of LUQ pain and nausea is unclear. Now with an 18 pound weight loss over the last 3-4 months. No significant improvement with PPI or Metamucil. I have encouraged him to use the Bentyl more regularly.   Rereviewed CT scan, ultrasound, HIDA, EGD, and colonoscopy findings with the patient and his wife (who participated by phone). EGD showed gastritis. It's an atypical presentation for symptomatic gallbladder disease but he had gallbladder sludge on ultrasound and pain occurred with drinking Ensure at the time of his HIDA scan. I've reviewed James Andrade's consultation note and he does not feel this is symptomatic gallbladder disease.  Given his weight loss, will check CMP and CBC. Will plan capsule endoscopy to evaluate for SB Crohn's. Trial of empiric Xifaxan for possible bacterial overgrowth +/- IBS.  If that is negative and symptoms persist despite Xifaxan, will pursue MRI/MRPC given his concurrent liver enzymes and need for better imaging of his pancreas and  biliary tree.   Reviewed his recent elevated transaminases.  Abdominal ultrasound, contrasted CT scan from 06/12/19  and liver enzymes were reviewed with the patient today. Duodenal biopsies were negative for sprue. No obvious cause that unites the enzymes and the pain.  Although his ANA was elevated it was mildly elevated and not expected to be related to autoimmune hepatitis given his other lab results.  Homa IR was 2.1.  I do not believe his liver enzymes are elevated enough to support a liver biopsy.  Will proceed with repeating liver enzymes today Fibrosure for further evaluation.    Mr. James Andrade has a positive ANA.  It is weakly positive.  It does not support a diagnosis of autoimmune hepatitis.  The clinical implications are unclear.  He is concerned about the clinical implications and I have asked him to follow-up with Dr. Farr. This is all the more important given his ongoing weight loss.   Anal fissure has improved. Continue high-fiber diet, drinking plenty of water, using a daily psyllium stool bulking agent, and nitroglycerin ointment as needed given his intermittent rectal bleeding.  Given his history of colon polyps on recent colonoscopy he should have a surveillance colonoscopy in 2023.       PLAN: Pantoprazole 40 mg BID x 8 weeks, use famotidine for breakthrough pain Continue Bentyl 20 mg QID (6 month refill provided today) Add Metamucil 1 tbsp daily Capsule endoscopy to evaluate for weight loss Trial of Xifaxan 550 mg TID x 14 days Continue high fiber   diet recommended, drink at least 1.5-2 liters of water Colonoscopy in 2023 CMP, CBC FibroSure  Follow-up with Dr. Pharr given the elevated ANA Follow-up with me in 4 weeks, or earlier as needed MRI/MRCP is VCE is negative  The nature of the virtual capsule endoscopy procedure, as well as the risks, benefits, and alternatives were carefully and thoroughly reviewed with the patient. Ample time for discussion and questions  allowed. The patient understood, was satisfied, and agreed to proceed.  Please see the "Patient Instructions" section for addition details about the plan.  HPI: James Andrade is a 28 y.o. male originally seen in consultation in July 2020 for rectal bleeding that started in January, anorexia, intermittent nausea, and now also with left sided abdominal pain. He also found to have elevated liver enzymes dating back to 2018. The interval history is obtained through the patient. His wife is a chemotherapy nurse at [employer] and participates in the office visit via speaker phone.  He had a colonoscopy 01/19/2019 for blood in the stool that revealed a posterior anal fissure, 4 small polyps in the sigmoid colon, ascending colon, and cecum.  3 of the polyps were adenomas and the third was sessile serrated polyp.  The exam was otherwise normal.  Surveillance colonoscopy recommended in 3 years.    This summer, Dr. Pharr rereferred him for abnormal liver enzymes dating back to 2018.   He had a normal ultrasound in 2018.  Was previously told this could be due to cholesterol.  No associated symptoms. Blood donation in high school.  No prior blood transfusion.  No history of jaundice or scleral icterus.  No history of use or experimentation with IV or intranasal street drugs.  No history of autoimmune disease.  No family history of liver disease. He is now on a low carb diet. Using fiber supplements, fish oil, and red yeast.    Since his last visit in September I have been able to obtain some additional prior liver enzymes: 12/27/2016: Total bilirubin 0.3, AST 30, ALT 50, alk phos 75 04/12/2017: Total bilirubin 0.8, AST 19, ALT 22, alk phos 68 12/20/2017: Total bilirubin 0.7, AST 33, ALT 51, alk phos 78 12/26/18: WBC 10.2, hgb 16.6, platelets 250, RDW 12.4, MCV 87.7, AST 55, ALT 115, alk phos 84, alb 4.9, TB 0.7, crt 0.97, Na 139, Lipids abnormal (cholesterol 329, HDL 37, LDL 239, triglycerides 267), TSH 4.11, UA  negative for bilirubin, Vit D low at 23.8 Labs 03/01/2019: Iron 142, ferritin 81, ceruloplasmin 34, IgG 972, IgM 59, glucose 87, ANA positive at 1-40, F-actin less than 20, total insulin 1hep B surface antigen negative, hep B core total antibody nonreactive, hep C antibody nonreactive  He called the office in October reporting left upper quadrant pain and nausea.   Awoke on October 12th with a headache and his stomach in knots. Associated nausea. LUQ pain.  Daily nausea persists. Intermittent waves of non-radiating, LUQ pain lasting from 5 minutes to many hours. Pain is predominantly nocturnal and has disrupted his sleep. Unable to eat any greasy or fatty foods. Eating soup. Post-prandial diarrhea that occurs within 30 minutes of eating. Most pronounced with red meat. + Eructation. Stools are now mucous. Had one day where the pain persisted for over 24 hours. No change with defecation or movement. Walking during the episodes may provide some relief.   Wife notes poor appetite. Only eating one meal a day. Has lost 18 pounds. Craving sweets.    Bentyl provided relief initially, but,   not as much now. No change in symptoms with Protonix.  No other associated symptoms. No identified exacerbating or relieving features.   Further evaluation has included: Abdominal ultrasound 04/05/19: gallbladder sludge; normal liver HIDA scan 04/03/2019: Patent biliary tree with normal gallbladder ejection fraction of 47%.  Abdominal pain occurred following Ensure ingestion. EGD 05/17/2019 showed an irregular Z-line, mild gastritis. Duodenal biopsies were normal.  Gastric biopsies showed mild reactive gastropathy and chronic gastritis without helical factor pylori.  Esophageal biopsies showed reactive/regenerative changes. CT abd/pelvis with contrast 06/12/19: No cause of symptoms identified  No marijuana or alcohol.  No NSAIDs.  Prior abdominal imaging: Abdominal ultrasound 01/19/17: normal Abdominal ultrasound  04/05/19: gallbladder sludge; normal liver HIDA scan 04/03/2019: Patent biliary tree with normal gallbladder ejection fraction of 47%.  Abdominal pain occurred following Ensure ingestion. CT abd/pelvis 06/12/19: No cause of symptoms identified  Endoscopic history: Colonoscopy 01/19/19: posterior anal fissure, 3 small tubular adenomas and 1 sessile serrated polyp EGD 05/17/2019: irregular Z-line, mild gastritis. Duodenal biopsies were normal.  Gastric biopsies showed mild reactive gastropathy and chronic gastritis without helical factor pylori.  Esophageal biopsies showed reactive/regenerative changes.  Stepfather with similar symptoms. Maternal grandmother required cholecystectomy.  Past Medical History:  Diagnosis Date  . Asthma    childhood  . Blood transfusion without reported diagnosis 2010   thinks after head injury  . Chronic headaches   . Depression    states after head injury, not on meds  . Hyperlipidemia    not on meds  . Seizures (HCC) 2010   did after brain trauma  . Visual changes     Past Surgical History:  Procedure Laterality Date  . BRAIN SURGERY  2010   hematoma from fall onto back of head, Dr Nudelman  . COLONOSCOPY  01/2019  . UPPER GASTROINTESTINAL ENDOSCOPY  05/17/2019  . WISDOM TOOTH EXTRACTION  2012    Current Outpatient Medications  Medication Sig Dispense Refill  . dicyclomine (BENTYL) 20 MG tablet Take 1 tablet (20 mg total) by mouth 4 (four) times daily as needed for spasms. 120 tablet 1  . famotidine (PEPCID) 20 MG tablet Take 1 tablet (20 mg total) by mouth 2 (two) times daily. 60 tablet 0  . ibuprofen (ADVIL) 200 MG tablet Take 400 mg by mouth every 6 (six) hours as needed.    . Melatonin 3 MG TABS Take 1 tablet by mouth at bedtime as needed.    . ondansetron (ZOFRAN) 4 MG tablet Take 1-2 tablets (4-8 mg total) by mouth every 6 (six) hours as needed for nausea or vomiting. 20 tablet 0  . pantoprazole (PROTONIX) 40 MG tablet Take 1 tablet (40 mg  total) by mouth 2 (two) times daily before a meal. 60 tablet 3  . rizatriptan (MAXALT-MLT) 10 MG disintegrating tablet Take 1 tablet (10 mg total) by mouth as needed for migraine. May repeat in 2 hours if needed 9 tablet 11  . topiramate (TOPAMAX) 50 MG tablet Take 1 tablet (50 mg total) by mouth 2 (two) times daily. 60 tablet 12   No current facility-administered medications for this visit.    Allergies as of 06/19/2019 - Review Complete 05/21/2019  Allergen Reaction Noted  . Penicillins Swelling   . Sulfa antibiotics Hives 08/21/2018  . Cephalexin Swelling     Family History  Problem Relation Age of Onset  . Hepatitis Sister   . Colon cancer Neg Hx   . Esophageal cancer Neg Hx   . Pancreatic cancer Neg Hx   .   Stomach cancer Neg Hx   . Rectal cancer Neg Hx     Social History   Socioeconomic History  . Marital status: Married    Spouse name: Lauren  . Number of children: 0  . Years of education: 73  . Highest education level: Not on file  Occupational History  . 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 Smoking status: Current Every Day Smoker    Packs/day: 0.50    Types: Cigarettes  . Smokeless tobacco: Never Used  Substance and Sexual Activity  . Alcohol use: Yes    Comment: 2 weekly; however, none currently as of 04/26/19  . Drug use: No  . Sexual activity: Not on file  Other Topics Concern  . Not on file  Social History Narrative   Lives with wife, children   Caffeine- 1-2 c daily   Social Determinants of Health   Financial Resource Strain:   . Difficulty of Paying Living Expenses: Not on file  Food Insecurity:   . Worried About Charity fundraiser in the Last Year: Not on file  . Ran Out of Food in the Last Year: Not on file  Transportation Needs:   . Lack of Transportation (Medical): Not on file  . Lack of Transportation (Non-Medical): Not on file  Physical Activity:   . Days of Exercise per Week: Not on file  .  Minutes of Exercise per Session: Not on file  Stress:   . Feeling of Stress : Not on file  Social Connections:   . Frequency of Communication with Friends and Family: Not on file  . Frequency of Social Gatherings with Friends and Family: Not on file  . Attends Religious Services: Not on file  . Active Member of Clubs or Organizations: Not on file  . Attends Archivist Meetings: Not on file  . Marital Status: Not on file  Intimate Partner Violence:   . Fear of Current or Ex-Partner: Not on file  . Emotionally Abused: Not on file  . Physically Abused: Not on file  . Sexually Abused: Not on file     Physical Exam: General:   Alert, in NAD. No scleral icterus. No bilateral temporal wasting. Congested.   Heart:  Regular rate and rhythm; no murmurs Pulm: Clear anteriorly; no wheezing Abdomen:  Soft. Nontender.  I am unable to reproduce his pain.  Nondistended. Normal bowel sounds. No rebound or guarding. No fluid wave.  LAD: No inguinal or umbilical LAD Extremities:  Without edema. Neurologic:  Alert and  oriented x4;  grossly normal neurologically; no asterixis or clonus. Skin: No jaundice. No palmar erythema or spider angioma.  Psych:  Alert and cooperative. Normal mood and affect.   Reiss Mowrey L. Tarri Glenn, MD, MPH [facility] 06/19/2019, 8:53 AM

## 2019-06-19 NOTE — Patient Instructions (Addendum)
We have sent the following medications to your pharmacy for you to pick up at your convenience:  Pantoprazole 40 mg twice a day, use Famotidine as needed for breakthrough pain   Continue Bentyl 20 mg four times a day   Add Metamucil 1 tablespoon daily   You have been scheduled for a capsule endoscopy. Please follow written instructions given to you at your visit today.  We have sent your demographic information and a prescription for Xifaxan to [facility]. This pharmacy is able to get medication approved through insurance and get you the lowest copay possible. If you have not heard from them within 1 week, please call our office at [phone] to let us know.  Your provider has requested that you go to the basement level for lab work before leaving today. Press "B" on the elevator. The lab is located at the first door on the left as you exit the elevator.  You will be due for a recall colonoscopy in 2023. We will send you a reminder in the mail when it gets closer to that time.   I value your feedback and thank you for entrusting [place] with your care. If you get a [facility] patient survey, I would appreciate you taking the time to let us know about your experience today. Thank you!   Due to recent changes in healthcare laws, you may see the results of your imaging and laboratory studies on MyChart before your provider has had a chance to review them.  We understand that in some cases there may be results that are confusing or concerning to you. Not all laboratory results come back in the same time frame and the provider may be waiting for multiple results in order to interpret others.  Please give [place] 48 hours in order for your provider to thoroughly review all the results before contacting the office for clarification of your results.

## 2019-06-21 ENCOUNTER — Encounter: Payer: Self-pay | Admitting: *Deleted

## 2019-06-21 LAB — NASH FIBROSURE
ALPHA 2-MACROGLOBULINS, QN: 207 mg/dL (ref 110–276)
ALT (SGPT) P5P: 43 IU/L (ref 0–55)
AST (SGOT) P5P: 26 IU/L (ref 0–40)
Apolipoprotein A-1: 125 mg/dL (ref 101–178)
Bilirubin, Total: 0.3 mg/dL (ref 0.0–1.2)
Cholesterol, Total: 270 mg/dL — ABNORMAL HIGH (ref 100–199)
Fibrosis Score: 0.1 (ref 0.00–0.21)
GGT: 30 IU/L (ref 0–65)
Glucose: 97 mg/dL (ref 65–99)
Haptoglobin: 187 mg/dL (ref 17–317)
Height: 70 in
NASH Score: 0.5 — ABNORMAL HIGH
Steatosis Score: 0.61 — ABNORMAL HIGH (ref 0.00–0.30)
Triglycerides: 256 mg/dL — ABNORMAL HIGH (ref 0–149)
Weight: 212 [lb_av]

## 2019-06-28 ENCOUNTER — Encounter: Payer: Self-pay | Admitting: Gastroenterology

## 2019-06-28 ENCOUNTER — Ambulatory Visit (INDEPENDENT_AMBULATORY_CARE_PROVIDER_SITE_OTHER): Payer: 59 | Admitting: Internal Medicine

## 2019-06-28 DIAGNOSIS — R634 Abnormal weight loss: Secondary | ICD-10-CM | POA: Diagnosis not present

## 2019-06-28 NOTE — Patient Instructions (Signed)
Patient states prep went well. Patient swallowed capsule without difficultly. Gave verbal and written instructions for the rest of the day,  Retrieval and mailing of the capsule. Also what to do if he doesn't pass it in 72 hrs. Lot#: [id number].  SN#: [id number]. Exp. Date: 07-24-2020

## 2019-07-06 ENCOUNTER — Ambulatory Visit: Payer: 59 | Admitting: Gastroenterology

## 2019-07-16 ENCOUNTER — Telehealth: Payer: Self-pay | Admitting: *Deleted

## 2019-07-16 ENCOUNTER — Encounter: Payer: Self-pay | Admitting: *Deleted

## 2019-07-16 NOTE — Telephone Encounter (Signed)
My Chart message sent to the patient.

## 2019-07-16 NOTE — Telephone Encounter (Signed)
-----   Message from Thornton Park, MD sent at 07/14/2019  9:19 AM EST ----- Please let the patient know that I reviewed his recent capsule endoscopy.  The study is completely normal except for a few gastric erosions.  Given these results, I am wondering if his Topamax may be responsible for his weight loss.  Please ask him to review this with the prescribing doctor.  Otherwise, I will see him in follow-up 07/26/2019 as previously planned.  Thank you.

## 2019-07-26 ENCOUNTER — Ambulatory Visit: Payer: 59 | Admitting: Gastroenterology

## 2019-07-26 ENCOUNTER — Encounter: Payer: Self-pay | Admitting: Gastroenterology

## 2019-07-26 VITALS — BP 120/72 | HR 72 | Temp 98.8°F | Ht 70.0 in | Wt 213.0 lb

## 2019-07-26 DIAGNOSIS — R748 Abnormal levels of other serum enzymes: Secondary | ICD-10-CM | POA: Diagnosis not present

## 2019-07-26 DIAGNOSIS — R634 Abnormal weight loss: Secondary | ICD-10-CM | POA: Diagnosis not present

## 2019-07-26 DIAGNOSIS — R1012 Left upper quadrant pain: Secondary | ICD-10-CM | POA: Diagnosis not present

## 2019-07-26 MED ORDER — FAMOTIDINE 20 MG PO TABS: 20.0000 mg | ORAL_TABLET | Freq: Two times a day (BID) | ORAL | 3 refills | Status: DC

## 2019-07-26 NOTE — Progress Notes (Signed)
Referring Provider: Deland Pretty, MD Primary Care Physician:  Deland Pretty, MD   Chief complaint: Abdominal pain, nausea, weight loss IMPRESSION:  Left upper quadrant pain with associated nausea    - abdominal ultrasound shows gallbladder sludge       - HIDA scan was normal except for pain with Ensure ingestion    - surgery (Dr. Dema Severin)  did not attribute symptoms to gallbladder disease Unintentional weight loss of 18 pounds in 3 months    - unexplained, now gaining weight Gastritis on EGD, gastric erosions on capsule endoscopy    - biopsies negative for H pylori    - patient denies NSAID use Anal fissure    - intolerant to nitroglycerin due to headaches     - resolved with nifedipine 0.03% with 5% lidocaine Abnormal transaminases    - labs 12/26/18: AST 55,  ALT 118      - abdominal ultrasound normal in 2018 and 2020    - HOMA-IR 2.1    - ANA + 1:40 without other markers for autoimmune hepatitis    - testing negative for HCV, HBV, hemochromatosis, autoimmune hepatitis    - NASH Fibrosure 06/19/19: S2 (moderate steatosis), F0 (no fibrosis), N1 (borderline/probable NASH) Vitamin D deficiency Personal history of colon polyps    - 3 tubular adenomas, one sessile serrated adenoma on colonoscopy 01/19/19    - surveillance colonoscopy recommended in 2023  Etiology of LUQ pain and nausea is unclear. Weight loss has now stabilized. No source identified on CT scan, ultrasound, HIDA, EGD, colonoscopy, or capsule endoscopy. EGD showed gastritis and capsule endoscopy showed gastric erosions. Consider trial desipramine, particularly given the sleep disturbance. If no improvement, will try to get of empiric Xifaxan approved for possible bacterial overgrowth +/- IBS.  If that is negative and symptoms persist despite Xifaxan, will pursue MRI/MRPC given his concurrent liver enzymes and need for better imaging of his pancreas and biliary tree.   Abnormal liver enzymes persist.  Abdominal ultrasound,  contrasted CT scan from 06/12/19  and liver enzymes were reviewed with the patient today. Duodenal biopsies were negative for sprue. No obvious cause that unites the enzymes and the pain.  Although his ANA was elevated it was mildly elevated and not expected to be related to autoimmune hepatitis given his other lab results.  Homa IR was 2.1. FibroSURE suggests boderline or probably NASH without any significant fibrosis.  I do not believe his liver enzymes are elevated enough to support a liver biopsy.  Will continue close monitoring.   Anal fissure has improved. Continue high-fiber diet, drinking plenty of water, using a daily psyllium stool bulking agent, and nitroglycerin ointment as needed given his intermittent rectal bleeding.  Given his history of colon polyps surveillance colonoscopy recommended in 2023.       PLAN: - Pantoprazole 40 mg BID, Famotidine 20 mg BID - Avoid all NSAIDS - Continue Bentyl 20 mg QID PRN (he is not findings this useful) - Continue Metamucil 1 tbsp daily - Continue high fiber diet recommended, drink at least 1.5-2 liters of water - EKG, if no prolonged QT will start a trial of desipramine 57m QHS - Will increase despiramine to 135mif no improvement after 2 weeks - Colonoscopy in 2023 - Follow-up in 2-3 months, earlier as needed   HPI: James HANWAYs a [age].o. male originally seen in consultation in July 2020 for rectal bleeding that started in January, anorexia, and intermittent nausea. He then developed left sided abdominal  pain and blood in the stool. He had a colonoscopy 01/19/2019 that revealed a posterior anal fissure, three tubular adenomas and and a sessile serrated polyp.  The exam was otherwise normal.  Surveillance colonoscopy recommended in 3 years.   Last summer, Dr. Shelia Media rereferred him for abnormal liver enzymes dating back to 2018.   Elevations have always been AST and ALT, with the ALT>AST. Bilirubin and alk phos has been normal. He had  a normal ultrasound in 2018.  Serologic evaluation was negative for Wilson's, autoimmune hepatitis, HBV, HCV, thyroid disease and hemochromatosis except for a positive ANA.   He called the office in October reporting acute onset intermittent left upper quadrant pain, nausea, and poor appetite. Associated post-prandial diarrhea that occurs within 30 minutes of eating. Pain is predominantly nocturnal and has disrupted his sleep. No association with defecation or movement. He lost 18 pounds. Abdominal ultrasound 04/05/19 showed gallbladder sludge; normal liver. HIDA scan 04/03/2019: was normal with normal gallbladder ejection fraction of 47%.  Abdominal pain occurred following Ensure ingestion. He saw surgery, who felt that his symptoms were not related to symptomatic gallbladder disease.   EGD 05/17/19 showed an irregular Z-line, mild gastritis. Duodenal biopsies were normal.  Gastric biopsies showed mild reactive gastropathy and chronic gastritis without H pylori.  Esophageal biopsies showed reactive/regenerative changes.  Subsequent CT abd/pelvis with contrast 06/12/19 showed no cause for his symptoms.   He continued to lose weight and a capsule endoscopy was performed 06/28/19 that showed gastric erosions. It was otherwise normal.   He returns now in scheduled follow-up. The interval history is obtained through the patient and review of his electronic health record.   His symptoms continue to fluctuate. Sleep schedule continues to be altered due to the intermittent pain.  His wife wonders if his symptoms could be related to stress. Things were worse in October. Better in November and December. Worse again over the last couple of months.  Tries not to get stressed.  He has been out of work since October due to these symptoms. Concerned that he couldn't work during this time. Doesn't feel like going anywhere due to the abdominal pain.   He has notice some improvement in appetite. Weight loss has  stabilized.  He is now at his baseline weigh.    Continues to have heartburn once or twice weekly despite pantoprazole 40 mg BID. No nocturnal symptoms.   He is no longer using Bentyl. Initially provided relief but then became ineffectual. No change in symptoms with Protonix.   We discussed that Topamax can be associated with weight loss. He hasn't noticed any side effects that he attributes to the Topamax. Started om November or December.    Further evaluation has included: Abdominal ultrasound 04/05/19: gallbladder sludge; normal liver HIDA scan 04/03/2019: Patent biliary tree with normal gallbladder ejection fraction of 47%.  Abdominal pain occurred following Ensure ingestion. EGD 05/17/2019 showed an irregular Z-line, mild gastritis. Duodenal biopsies were normal.  Gastric biopsies showed mild reactive gastropathy and chronic gastritis without helical factor pylori.  Esophageal biopsies showed reactive/regenerative changes. CT abd/pelvis with contrast 06/12/19: No cause of symptoms identified Capsule endoscopy 06/28/19: gastric erosions. Otherwise normal.   No marijuana or alcohol.  No NSAIDs.  Prior abdominal imaging: Abdominal ultrasound 01/19/17: normal Abdominal ultrasound 04/05/19: gallbladder sludge; normal liver HIDA scan 04/03/2019: Patent biliary tree with normal gallbladder ejection fraction of 47%.  Abdominal pain occurred following Ensure ingestion. CT abd/pelvis 06/12/19: No cause of symptoms identified  Endoscopic history: Colonoscopy 01/19/19:  posterior anal fissure, 3 small tubular adenomas and 1 sessile serrated polyp EGD 05/17/2019: irregular Z-line, mild gastritis. Duodenal biopsies were normal.  Gastric biopsies showed mild reactive gastropathy and chronic gastritis without helical factor pylori.  Esophageal biopsies showed reactive/regenerative changes. Capsule endoscopy 06/28/19: gastric erosions. Otherwise normal.   Stepfather with similar symptoms. Maternal  grandmother required cholecystectomy.  Past Medical History:  Diagnosis Date  . Asthma    childhood  . Blood transfusion without reported diagnosis 2010   thinks after head injury  . Chronic headaches   . Depression    states after head injury, not on meds  . Hyperlipidemia    not on meds  . Seizures ([place]) 2010   did after brain trauma  . Visual changes     Past Surgical History:  Procedure Laterality Date  . BRAIN SURGERY  2010   hematoma from fall onto back of head, Dr Sherwood Gambler  . COLONOSCOPY  01/2019  . UPPER GASTROINTESTINAL ENDOSCOPY  05/17/2019  . WISDOM TOOTH EXTRACTION  2012    Current Outpatient Medications  Medication Sig Dispense Refill  . dicyclomine (BENTYL) 20 MG tablet Take 1 tablet (20 mg total) by mouth 4 (four) times daily as needed for spasms. 120 tablet 1  . famotidine (PEPCID) 20 MG tablet Take 1 tablet (20 mg total) by mouth 2 (two) times daily. 60 tablet 0  . ibuprofen (ADVIL) 200 MG tablet Take 400 mg by mouth every 6 (six) hours as needed.    . Melatonin 3 MG TABS Take 1 tablet by mouth at bedtime as needed.    . ondansetron (ZOFRAN) 4 MG tablet Take 1-2 tablets (4-8 mg total) by mouth every 6 (six) hours as needed for nausea or vomiting. 20 tablet 0  . pantoprazole (PROTONIX) 40 MG tablet Take 1 tablet (40 mg total) by mouth 2 (two) times daily before a meal. 60 tablet 3  . rifaximin (XIFAXAN) 550 MG TABS tablet Take 1 tablet (550 mg total) by mouth 2 (two) times daily. 20 tablet 0  . rizatriptan (MAXALT-MLT) 10 MG disintegrating tablet Take 1 tablet (10 mg total) by mouth as needed for migraine. May repeat in 2 hours if needed 9 tablet 11  . topiramate (TOPAMAX) 50 MG tablet Take 1 tablet (50 mg total) by mouth 2 (two) times daily. 60 tablet 12   No current facility-administered medications for this visit.    Allergies as of 07/26/2019 - Review Complete 07/26/2019  Allergen Reaction Noted  . Penicillins Swelling   . Sulfa antibiotics Hives  08/21/2018  . Cephalexin Swelling     Family History  Problem Relation Age of Onset  . Hepatitis Sister   . Colon cancer Neg Hx   . Esophageal cancer Neg Hx   . Pancreatic cancer Neg Hx   . Stomach cancer Neg Hx   . Rectal cancer Neg Hx     Social History   Socioeconomic History  . Marital status: Married    Spouse name: Lauren  . Number of children: 0  . Years of education: 34  . Highest education level: Not on file  Occupational History  . 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 Smoking status: Current Every Day Smoker    Packs/day: 0.50    Types: Cigarettes  . Smokeless tobacco: Never Used  Substance and Sexual Activity  . Alcohol use: Yes    Comment: 2 weekly; however, none currently as of 04/26/19  . Drug  use: No  . Sexual activity: Not on file  Other Topics Concern  . Not on file  Social History Narrative   Lives with wife, children   Caffeine- 1-2 c daily   Social Determinants of Health   Financial Resource Strain:   . Difficulty of Paying Living Expenses: Not on file  Food Insecurity:   . Worried About Charity fundraiser in the Last Year: Not on file  . Ran Out of Food in the Last Year: Not on file  Transportation Needs:   . Lack of Transportation (Medical): Not on file  . Lack of Transportation (Non-Medical): Not on file  Physical Activity:   . Days of Exercise per Week: Not on file  . Minutes of Exercise per Session: Not on file  Stress:   . Feeling of Stress : Not on file  Social Connections:   . Frequency of Communication with Friends and Family: Not on file  . Frequency of Social Gatherings with Friends and Family: Not on file  . Attends Religious Services: Not on file  . Active Member of Clubs or Organizations: Not on file  . Attends Archivist Meetings: Not on file  . Marital Status: Not on file  Intimate Partner Violence:   . Fear of Current or Ex-Partner: Not on file  . Emotionally Abused:  Not on file  . Physically Abused: Not on file  . Sexually Abused: Not on file     Physical Exam: General:   Alert, in NAD. No scleral icterus. No bilateral temporal wasting. Congested.   Heart:  Regular rate and rhythm; no murmurs Pulm: Clear anteriorly; no wheezing Abdomen:  Soft. Nontender.  I am unable to reproduce his pain.  Nondistended. Normal bowel sounds. No rebound or guarding. No fluid wave.  LAD: No inguinal or umbilical LAD Extremities:  Without edema. Neurologic:  Alert and  oriented x4;  grossly normal neurologically; no asterixis or clonus. Skin: No jaundice. No palmar erythema or spider angioma.  Psych:  Alert and cooperative. Normal mood and affect.   James Andrade L. Tarri Glenn, MD, MPH [facility] 07/26/2019, 10:51 AM

## 2019-07-26 NOTE — Patient Instructions (Signed)
Continue Pantoprazole 40 mg twice a day, Famotidine 20 mg twice a day and Bentyl 20 mg four times a day   Continue High fiber diet and continue drinking plenty of water.  You will be due for a recall colonoscopy in 2023. We will send you a reminder in the mail when it gets closer to that time.  We will call Dr. Pennie Banter office to get you set up for an EKG.

## 2019-07-27 ENCOUNTER — Telehealth: Payer: Self-pay | Admitting: Gastroenterology

## 2019-07-27 NOTE — Telephone Encounter (Signed)
Noted. Thanks.

## 2019-07-27 NOTE — Telephone Encounter (Signed)
Noted! Thank you

## 2019-08-13 NOTE — Telephone Encounter (Signed)
Please follow-up with Dr. Pennie Banter office on 3/20 to obtain a copy of the EKG. Thanks.

## 2019-08-13 NOTE — Telephone Encounter (Signed)
Received message from Dr. Pennie Banter office, patient did not show up for EKG on 07/31/19. Rescheduled to 3/19 and will also see Dr Shelia Media that day.

## 2019-08-13 NOTE — Telephone Encounter (Signed)
Will set personal reminder to call for results after his visit with Dr Shelia Media.

## 2019-09-04 ENCOUNTER — Encounter: Payer: Self-pay | Admitting: *Deleted

## 2019-09-04 ENCOUNTER — Telehealth: Payer: Self-pay | Admitting: *Deleted

## 2019-09-04 ENCOUNTER — Other Ambulatory Visit: Payer: Self-pay | Admitting: *Deleted

## 2019-09-04 MED ORDER — DESIPRAMINE HCL 10 MG PO TABS: 5.0000 mg | ORAL_TABLET | Freq: Every day | ORAL | 6 refills | Status: AC

## 2019-09-04 NOTE — Telephone Encounter (Signed)
#  30 with 6 refills. Thank you.

## 2019-09-04 NOTE — Telephone Encounter (Signed)
-----   Message from [place], MD sent at 09/04/2019  7:47 AM EDT ----- Please call the patient. I reviewed his recent EKG. It is safe to start a new medication. As we discussed during his last visit, I recommend a trial of desipramine 5mg  QHS, This is a low dose and we can titrate it up slowly over time as needed. It can take 6-8 weeks to really start to make a difference. Thank you.

## 2019-09-04 NOTE — Telephone Encounter (Signed)
Dr. Tarri Glenn, please confirm desipramine 5 mg supply and number of refills?

## 2019-09-04 NOTE — Telephone Encounter (Signed)
Medication ordered for patient electronically.   MyChart message sent to patient.

## 2019-09-10 ENCOUNTER — Encounter: Payer: Self-pay | Admitting: *Deleted

## 2019-09-10 ENCOUNTER — Telehealth: Payer: Self-pay | Admitting: Gastroenterology

## 2019-09-10 NOTE — Telephone Encounter (Signed)
Desiree, please be on the lookout for a work note that was faxed to [employer] machine to place in Dr. Tarri Glenn box from this patient.

## 2019-09-10 NOTE — Telephone Encounter (Signed)
Pt stated that a work note from [employer] was faxed to [phone] 09/07/19.  FYI.

## 2019-09-10 NOTE — Telephone Encounter (Signed)
Thank you Desiree. I will send the patient a MyChart message letting him know the work note was received and Dr. Tarri Glenn will tend to it when she is back in the office from vacation.

## 2019-09-10 NOTE — Telephone Encounter (Signed)
Note has been received and was place in Dr beavers box today. She will be out of the office until 3/31.

## 2019-09-13 ENCOUNTER — Telehealth: Payer: Self-pay | Admitting: Gastroenterology

## 2019-09-13 NOTE — Telephone Encounter (Signed)
I have faxed the completed copy of the form back to his work.

## 2019-09-13 NOTE — Telephone Encounter (Signed)
Please provide a copy of the form and I will complete it with a clearance to return to work. I am glad that he is feeling well enough to return to work. Thanks for the update.

## 2019-09-13 NOTE — Telephone Encounter (Signed)
Pt states the note that was faxed over does not state that he can return to work and it does not specify that he needs to stay out of work. For him to be able to return to work it has to state that he can return to work with a date. He feels he can go back to work at this time. He is requesting the note be adjusted for him.

## 2019-09-13 NOTE — Telephone Encounter (Signed)
Patient called and he said the letter for work in not clear on when he can return and would like to discuss a few things for he can not go back until the letter gets clarified.

## 2019-10-24 ENCOUNTER — Other Ambulatory Visit: Payer: Self-pay

## 2019-10-24 ENCOUNTER — Ambulatory Visit
Admission: EM | Admit: 2019-10-24 | Discharge: 2019-10-24 | Disposition: A | Discharge status: Home / Self Care | Payer: 59 | Attending: Family Medicine | Admitting: Family Medicine

## 2019-10-24 DIAGNOSIS — J0110 Acute frontal sinusitis, unspecified: Secondary | ICD-10-CM | POA: Diagnosis not present

## 2019-10-24 DIAGNOSIS — J3489 Other specified disorders of nose and nasal sinuses: Secondary | ICD-10-CM | POA: Diagnosis not present

## 2019-10-24 DIAGNOSIS — H9203 Otalgia, bilateral: Secondary | ICD-10-CM

## 2019-10-24 MED ORDER — AZITHROMYCIN 250 MG PO TABS: ORAL_TABLET | ORAL | 0 refills | Status: DC

## 2019-10-24 NOTE — ED Triage Notes (Signed)
Pt c/o sore throat, nasal drainage started yesterday. Today sinus pressure started. Pt states more ear wax than normal past 4 days. Pt denies dysphagia.

## 2019-10-24 NOTE — Discharge Instructions (Addendum)
You have a sinus infection.   I have sent in a z-pack for you to take.   If you're not feeling better over the net couple of days, follow up and let us know.

## 2019-10-24 NOTE — ED Provider Notes (Signed)
[facility]   [id number] 10/24/19 Arrival Time: [id number]  [id number] THROAT  SUBJECTIVE: History from: patient.  James Andrade is a 29 y.o. male who presents with abrupt onset of sore throat for 2 days .  Denies sick exposure to Covid, strep, flu or mono, or precipitating event.  Has tried no OTC meds.  Symptoms are made worse with swallowing, but tolerating liquids and own secretions without difficulty.  Reports previous symptoms in the past. Reports bilateral ear "fullness," post nasal drip, sinus pain and pressure for the last week.  Denies fever, chills, fatigue, rhinorrhea, cough, SOB, wheezing, chest pain, nausea, rash, changes in bowel or bladder habits.      ROS: As per HPI.  All other pertinent ROS negative.     Past Medical History:  Diagnosis Date  . Asthma    childhood  . Blood transfusion without reported diagnosis 2010   thinks after head injury  . Chronic headaches   . Depression    states after head injury, not on meds  . Hyperlipidemia    not on meds  . Seizures ([place]) 2010   did after brain trauma  . Visual changes    Past Surgical History:  Procedure Laterality Date  . BRAIN SURGERY  2010   hematoma from fall onto back of head, Dr Sherwood Gambler  . COLONOSCOPY  01/2019  . UPPER GASTROINTESTINAL ENDOSCOPY  05/17/2019  . WISDOM TOOTH EXTRACTION  2012   Allergies  Allergen Reactions  . Penicillins Swelling    REACTION: Throat swells  . Sulfa Antibiotics Hives  . Cephalexin Swelling    REACTION: throat swells   No current facility-administered medications on file prior to encounter.   Current Outpatient Medications on File Prior to Encounter  Medication Sig Dispense Refill  . desipramine (NOPRAMIN) 10 MG tablet Take 0.5 tablets (5 mg total) by mouth at bedtime. 15 tablet 6  . dicyclomine (BENTYL) 20 MG tablet Take 1 tablet (20 mg total) by mouth 4 (four) times daily as needed for spasms. 120 tablet 1  . famotidine (PEPCID) 20 MG tablet Take  1 tablet (20 mg total) by mouth 2 (two) times daily. 60 tablet 0  . famotidine (PEPCID) 20 MG tablet Take 1 tablet (20 mg total) by mouth 2 (two) times daily. 60 tablet 3  . ibuprofen (ADVIL) 200 MG tablet Take 400 mg by mouth every 6 (six) hours as needed.    . Melatonin 3 MG TABS Take 1 tablet by mouth at bedtime as needed.    . ondansetron (ZOFRAN) 4 MG tablet Take 1-2 tablets (4-8 mg total) by mouth every 6 (six) hours as needed for nausea or vomiting. 20 tablet 0  . pantoprazole (PROTONIX) 40 MG tablet Take 1 tablet (40 mg total) by mouth 2 (two) times daily before a meal. 60 tablet 3  . rifaximin (XIFAXAN) 550 MG TABS tablet Take 1 tablet (550 mg total) by mouth 2 (two) times daily. 20 tablet 0  . rizatriptan (MAXALT-MLT) 10 MG disintegrating tablet Take 1 tablet (10 mg total) by mouth as needed for migraine. May repeat in 2 hours if needed 9 tablet 11  . topiramate (TOPAMAX) 50 MG tablet Take 1 tablet (50 mg total) by mouth 2 (two) times daily. 60 tablet 12   Social History   Socioeconomic History  . Marital status: Married    Spouse name: Lauren  . Number of children: 0  . Years of education: 23  . Highest education level: Not on file  Occupational History  . 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 Smoking status: Current Every Day Smoker    Packs/day: 0.50    Types: Cigarettes  . Smokeless tobacco: Never Used  Substance and Sexual Activity  . Alcohol use: Not Currently  . Drug use: No  . Sexual activity: Not on file  Other Topics Concern  . Not on file  Social History Narrative   Lives with wife, children   Caffeine- 1-2 c daily   Social Determinants of Health   Financial Resource Strain:   . Difficulty of Paying Living Expenses:   Food Insecurity:   . Worried About Charity fundraiser in the Last Year:   . Arboriculturist in the Last Year:   Transportation Needs:   . Film/video editor (Medical):   Marland Kitchen Lack of Transportation  (Non-Medical):   Physical Activity:   . Days of Exercise per Week:   . Minutes of Exercise per Session:   Stress:   . Feeling of Stress :   Social Connections:   . Frequency of Communication with Friends and Family:   . Frequency of Social Gatherings with Friends and Family:   . Attends Religious Services:   . Active Member of Clubs or Organizations:   . Attends Archivist Meetings:   Marland Kitchen Marital Status:   Intimate Partner Violence:   . Fear of Current or Ex-Partner:   . Emotionally Abused:   Marland Kitchen Physically Abused:   . Sexually Abused:    Family History  Problem Relation Age of Onset  . Hepatitis Sister   . Colon cancer Neg Hx   . Esophageal cancer Neg Hx   . Pancreatic cancer Neg Hx   . Stomach cancer Neg Hx   . Rectal cancer Neg Hx     OBJECTIVE:  Vitals:   10/24/19 1121 10/24/19 1123  BP: 137/82   Pulse: 82   Resp: 16   Temp: 98.7 F (37.1 C)   TempSrc: Oral   SpO2: 98%   Weight:  215 lb (97.5 kg)  Height:  5\' 10"  (1.778 m)     General appearance: alert; appears fatigued, but nontoxic, speaking in full sentences and managing own secretions HEENT: NCAT; Ears: EACs clear, TMs pearly gray with visible cone of light, without erythema; Eyes: PERRL, EOMI grossly; Nose: no obvious rhinorrhea; Throat: oropharynx clear, tonsils 1+ and mildly erythematous without white tonsillar exudates, uvula midline; Frontal sinus tenderness to palpation Neck: supple without LAD Lungs: CTA bilaterally without adventitious breath sounds; cough absent Heart: regular rate and rhythm.  Radial pulses 2+ symmetrical bilaterally Skin: warm and dry Psychological: alert and cooperative; normal mood and affect  LABS: No results found for this or any previous visit (from the past 24 hour(s)).   ASSESSMENT & PLAN:  1. Otalgia of both ears   2. Acute non-recurrent frontal sinusitis   3. Sinus pressure     Meds ordered this encounter  Medications  . azithromycin (ZITHROMAX Z-PAK)  250 MG tablet    Sig: Take 2 tablets today, and 1 tablet for the next 4 days.    Dispense:  6 tablet    Refill:  0    Order Specific Question:   Supervising Provider    Answer:   Chase Picket [id number]       Get plenty of rest and push fluids Prescribed Z-pack Take OTC allergy medication daily  Drink warm or cool liquids,  use throat lozenges, or popsicles to help alleviate symptoms Take OTC ibuprofen or tylenol as needed for pain Follow up with PCP if symptoms persists Return or go to ER if patient has any new or worsening symptoms such as fever, chills, nausea, vomiting, worsening sore throat, cough, abdominal pain, chest pain, changes in bowel or bladder habits, etc...  Reviewed expectations re: course of current medical issues. Questions answered. Outlined signs and symptoms indicating need for more acute intervention. Patient verbalized understanding. After Visit Summary given.  Work note provided for today.         Faustino Congress, NP 10/24/19 1139

## 2019-11-19 ENCOUNTER — Ambulatory Visit: Payer: 59 | Admitting: Family Medicine

## 2019-11-19 ENCOUNTER — Telehealth: Payer: Self-pay | Admitting: Family Medicine

## 2019-11-19 NOTE — Telephone Encounter (Signed)
Patient r/s for 02/14/20.

## 2020-02-14 ENCOUNTER — Encounter: Payer: Self-pay | Admitting: Family Medicine

## 2020-02-14 ENCOUNTER — Ambulatory Visit: Payer: 59 | Admitting: Family Medicine

## 2020-02-22 ENCOUNTER — Encounter: Payer: Self-pay | Admitting: Emergency Medicine

## 2020-02-22 ENCOUNTER — Ambulatory Visit (INDEPENDENT_AMBULATORY_CARE_PROVIDER_SITE_OTHER): Payer: 59

## 2020-02-22 ENCOUNTER — Other Ambulatory Visit: Payer: Self-pay

## 2020-02-22 ENCOUNTER — Ambulatory Visit
Admission: EM | Admit: 2020-02-22 | Discharge: 2020-02-22 | Disposition: A | Discharge status: Home / Self Care | Payer: 59 | Attending: Emergency Medicine | Admitting: Emergency Medicine

## 2020-02-22 DIAGNOSIS — J189 Pneumonia, unspecified organism: Secondary | ICD-10-CM | POA: Diagnosis not present

## 2020-02-22 DIAGNOSIS — R0602 Shortness of breath: Secondary | ICD-10-CM

## 2020-02-22 MED ORDER — DEXAMETHASONE 4 MG PO TABS: 4.0000 mg | ORAL_TABLET | Freq: Every day | ORAL | 0 refills | Status: AC

## 2020-02-22 MED ORDER — ALBUTEROL SULFATE HFA 108 (90 BASE) MCG/ACT IN AERS: 1.0000 | INHALATION_SPRAY | Freq: Four times a day (QID) | RESPIRATORY_TRACT | 0 refills | Status: DC | PRN

## 2020-02-22 NOTE — Discharge Instructions (Signed)
Get plenty of rest and push fluids ProAir prescribed for shortness of breath  Decadron was prescribed use medications daily for symptom relief Use OTC medications like ibuprofen or tylenol as needed fever or pain Call or go to the ED if you have any new or worsening symptoms such as fever, worsening cough, shortness of breath, chest tightness, chest pain, turning blue, changes in mental status, etc..Marland Kitchen

## 2020-02-22 NOTE — ED Provider Notes (Addendum)
[place]   [id number] 02/22/20 Arrival Time: [phone]   Chief Complaint  Patient presents with  . Shortness of Breath     SUBJECTIVE: History from: patient.  James Andrade is a 29 y.o. male who presents to the urgent care with a complaint of shortness of breath that started this past Monday.  Denies sick exposure to COVID, flu or strep.  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  Got a PCR COVID-19 test and results was negative.  Denies recent travel.  Has not tried any OTC medication.  Symptoms are made worse with lying down.  Denies previous symptoms in the past.   Denies fever, chills, fatigue, sinus pain, rhinorrhea, sore throat.  ROS: As per HPI.  All other pertinent ROS negative.      Past Medical History:  Diagnosis Date  . Asthma    childhood  . Blood transfusion without reported diagnosis 2010   thinks after head injury  . Chronic headaches   . Depression    states after head injury, not on meds  . Hyperlipidemia    not on meds  . Seizures ([place]) 2010   did after brain trauma  . Visual changes    Past Surgical History:  Procedure Laterality Date  . BRAIN SURGERY  2010   hematoma from fall onto back of head, Dr Sherwood Gambler  . COLONOSCOPY  01/2019  . UPPER GASTROINTESTINAL ENDOSCOPY  05/17/2019  . WISDOM TOOTH EXTRACTION  2012   Allergies  Allergen Reactions  . Penicillins Swelling    REACTION: Throat swells  . Sulfa Antibiotics Hives  . Cephalexin Swelling    REACTION: throat swells   No current facility-administered medications on file prior to encounter.   Current Outpatient Medications on File Prior to Encounter  Medication Sig Dispense Refill  . azithromycin (ZITHROMAX Z-PAK) 250 MG tablet Take 2 tablets today, and 1 tablet for the next 4 days. 6 tablet 0  . desipramine (NOPRAMIN) 10 MG tablet Take 0.5 tablets (5 mg total) by mouth at bedtime. 15 tablet 6  . dicyclomine (BENTYL)  20 MG tablet Take 1 tablet (20 mg total) by mouth 4 (four) times daily as needed for spasms. 120 tablet 1  . famotidine (PEPCID) 20 MG tablet Take 1 tablet (20 mg total) by mouth 2 (two) times daily. 60 tablet 0  . famotidine (PEPCID) 20 MG tablet Take 1 tablet (20 mg total) by mouth 2 (two) times daily. 60 tablet 3  . ibuprofen (ADVIL) 200 MG tablet Take 400 mg by mouth every 6 (six) hours as needed.    . Melatonin 3 MG TABS Take 1 tablet by mouth at bedtime as needed.    . ondansetron (ZOFRAN) 4 MG tablet Take 1-2 tablets (4-8 mg total) by mouth every 6 (six) hours as needed for nausea or vomiting. 20 tablet 0  . pantoprazole (PROTONIX) 40 MG tablet Take 1 tablet (40 mg total) by mouth 2 (two) times daily before a meal. 60 tablet 3  . rifaximin (XIFAXAN) 550 MG TABS tablet Take 1 tablet (550 mg total) by mouth 2 (two) times daily. 20 tablet 0  . rizatriptan (MAXALT-MLT) 10 MG disintegrating tablet Take 1 tablet (10 mg total) by mouth as needed for migraine. May repeat in 2 hours if needed 9 tablet 11  . topiramate (TOPAMAX) 50 MG tablet Take 1 tablet (50 mg total) by mouth 2 (two) times daily. 60 tablet 12   Social  History   Socioeconomic History  . Marital status: Married    Spouse name: Lauren  . Number of children: 0  . Years of education: 68  . Highest education level: Not on file  Occupational History  . 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 Smoking status: Current Every Day Smoker    Packs/day: 0.50    Types: Cigarettes  . Smokeless tobacco: Never Used  Vaping Use  . Vaping Use: Never used  Substance and Sexual Activity  . Alcohol use: Not Currently  . Drug use: No  . Sexual activity: Not on file  Other Topics Concern  . Not on file  Social History Narrative   Lives with wife, children   Caffeine- 1-2 c daily   Social Determinants of Health   Financial Resource Strain:   . Difficulty of Paying Living Expenses: Not on file  Food  Insecurity:   . Worried About Charity fundraiser in the Last Year: Not on file  . Ran Out of Food in the Last Year: Not on file  Transportation Needs:   . Lack of Transportation (Medical): Not on file  . Lack of Transportation (Non-Medical): Not on file  Physical Activity:   . Days of Exercise per Week: Not on file  . Minutes of Exercise per Session: Not on file  Stress:   . Feeling of Stress : Not on file  Social Connections:   . Frequency of Communication with Friends and Family: Not on file  . Frequency of Social Gatherings with Friends and Family: Not on file  . Attends Religious Services: Not on file  . Active Member of Clubs or Organizations: Not on file  . Attends Archivist Meetings: Not on file  . Marital Status: Not on file  Intimate Partner Violence:   . Fear of Current or Ex-Partner: Not on file  . Emotionally Abused: Not on file  . Physically Abused: Not on file  . Sexually Abused: Not on file   Family History  Problem Relation Age of Onset  . Hepatitis Sister   . Colon cancer Neg Hx   . Esophageal cancer Neg Hx   . Pancreatic cancer Neg Hx   . Stomach cancer Neg Hx   . Rectal cancer Neg Hx     OBJECTIVE:  Vitals:   02/22/20 0901 02/22/20 0903  BP: (!) 158/83   Pulse: 64   Resp: 17   Temp: 98.8 F (37.1 C)   TempSrc: Oral   SpO2: 97%   Weight:  225 lb (102.1 kg)  Height:  5\' 10"  (1.778 m)     General appearance: alert; appears fatigued, but nontoxic; speaking in full sentences and tolerating own secretions HEENT: NCAT; Ears: EACs clear, TMs pearly gray; Eyes: PERRL.  EOM grossly intact. Sinuses: nontender; Nose: nares patent without rhinorrhea, Throat: oropharynx clear, tonsils non erythematous or enlarged, uvula midline  Neck: supple without LAD Lungs: unlabored respirations, symmetrical air entry; cough: absent; no respiratory distress; CTAB Heart: regular rate and rhythm.  Radial pulses 2+ symmetrical bilaterally Skin: warm and  dry Psychological: alert and cooperative; normal mood and affect  LABS:  No results found for this or any previous visit (from the past 24 hour(s)).   RADIOLOGY:  DG Chest 2 View  Result Date: 02/22/2020 CLINICAL DATA:  Shortness of breath. EXAM: CHEST - 2 VIEW COMPARISON:  Chest x-ray 11/17/2010. FINDINGS: Mediastinum hilar structures normal. Bilateral interstitial prominence noted. Pneumonitis could  present in this fashion. No pleural effusion or pneumothorax. Heart size normal. No acute bony abnormality. IMPRESSION: Bilateral interstitial prominence noted. Pneumonitis present in this fashion. Electronically Signed   By: Marcello Moores  Register   On: 02/22/2020 09:34     Chest X-ray is also due for possible pneumonitis.  I have reviewed the x-ray myself and the radiologist interpretation.  I am in agreement with the radiologist interpretation.  ASSESSMENT & PLAN:  1. Shortness of breath   2. Pneumonitis     Meds ordered this encounter  Medications  . albuterol (VENTOLIN HFA) 108 (90 Base) MCG/ACT inhaler    Sig: Inhale 1-2 puffs into the lungs every 6 (six) hours as needed for wheezing or shortness of breath.    Dispense:  18 g    Refill:  0  . dexamethasone (DECADRON) 4 MG tablet    Sig: Take 1 tablet (4 mg total) by mouth daily for 6 days.    Dispense:  6 tablet    Refill:  0    Discharge instructions   Get plenty of rest and push fluids ProAir prescribed for shortness of breath  Decadron was prescribed use medications daily for symptom relief Use OTC medications like ibuprofen or tylenol as needed fever or pain Call or go to the ED if you have any new or worsening symptoms such as fever, worsening cough, shortness of breath, chest tightness, chest pain, turning blue, changes in mental status, etc...   Reviewed expectations re: course of current medical issues. Questions answered. Outlined signs and symptoms indicating need for more acute intervention. Patient verbalized  understanding. After Visit Summary given.      Note: This document was prepared using Dragon voice recognition software and may include unintentional dictation errors.    Emerson Monte, [place] 02/22/20 [place]    Emerson Monte, FNP 02/22/20 [phone]

## 2020-02-22 NOTE — ED Triage Notes (Signed)
Pt started feeling shortness of breath on Monday.  Pt reports that it feels worse when he lays down at night.  Got pcr covid test done on Tuesday, got neg results yesterday.  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

## 2020-11-05 ENCOUNTER — Ambulatory Visit
Admission: EM | Admit: 2020-11-05 | Discharge: 2020-11-05 | Disposition: A | Discharge status: Home / Self Care | Payer: 59 | Attending: Family Medicine | Admitting: Family Medicine

## 2020-11-05 ENCOUNTER — Other Ambulatory Visit: Payer: Self-pay

## 2020-11-05 ENCOUNTER — Encounter: Payer: Self-pay | Admitting: Emergency Medicine

## 2020-11-05 DIAGNOSIS — H66003 Acute suppurative otitis media without spontaneous rupture of ear drum, bilateral: Secondary | ICD-10-CM

## 2020-11-05 DIAGNOSIS — Z20822 Contact with and (suspected) exposure to covid-19: Secondary | ICD-10-CM

## 2020-11-05 MED ORDER — CLARITHROMYCIN 500 MG PO TABS: 500.0000 mg | ORAL_TABLET | Freq: Two times a day (BID) | ORAL | 0 refills | Status: AC

## 2020-11-05 MED ORDER — PREDNISONE 10 MG (21) PO TBPK: ORAL_TABLET | Freq: Every day | ORAL | 0 refills | Status: AC

## 2020-11-05 NOTE — Discharge Instructions (Signed)
I have prescribed clarithromycin twice a day for 7 days  I have sent in a prednisone taper for you to take for 6 days. 6 tablets on day one, 5 tablets on day two, 4 tablets on day three, 3 tablets on day four, 2 tablets on day five, and 1 tablet on day six.  Follow up with this office or with primary care if symptoms are persisting.  Follow up in the ER for high fever, trouble swallowing, trouble breathing, other concerning symptoms.

## 2020-11-05 NOTE — ED Triage Notes (Signed)
covid positive 7 days ago.  Bilateral ear pain that started last night.

## 2020-11-05 NOTE — ED Provider Notes (Signed)
[facility]    CSN: [id number] Arrival date & time: 11/05/20  0904      History   Chief Complaint No chief complaint on file.   HPI James Andrade is a 30 y.o. male.   Reports bilateral ear pain that started last night.  Reports that he tested positive for COVID 7 days ago.  Has been having cough, nasal congestion for the last few days.  Reports that his wife had COVID, and then he was testing just to see if he did.  States he did not have symptoms when he tested positive for COVID.  Has negative history of COVID, other than this episode.  Has not completed COVID vaccines.  Has not completed flu vaccine.  Denies chills, shortness of breath, nausea, vomiting, diarrhea, rash, fever, other symptoms.  ROS per HPI  The history is provided by the patient.    Past Medical History:  Diagnosis Date  . Asthma    childhood  . Blood transfusion without reported diagnosis 2010   thinks after head injury  . Chronic headaches   . Depression    states after head injury, not on meds  . Hyperlipidemia    not on meds  . Seizures ([place]) 2010   did after brain trauma  . Visual changes     Patient Active Problem List   Diagnosis Date Noted  . SINUS BRADYCARDIA 03/24/2010  . WRIST PAIN, LEFT 09/18/2008  . ANKLE PAIN 09/18/2008  . FRACTURE, MEDIAL MALLEOLUS 04/17/2007    Past Surgical History:  Procedure Laterality Date  . BRAIN SURGERY  2010   hematoma from fall onto back of head, Dr Sherwood Gambler  . COLONOSCOPY  01/2019  . UPPER GASTROINTESTINAL ENDOSCOPY  05/17/2019  . WISDOM TOOTH EXTRACTION  2012       Home Medications    Prior to Admission medications   Medication Sig Start Date End Date Taking? Authorizing Provider  clarithromycin (BIAXIN) 500 MG tablet Take 1 tablet (500 mg total) by mouth 2 (two) times daily for 7 days. 11/05/20 11/12/20 Yes Faustino Congress, NP  predniSONE (STERAPRED UNI-PAK 21 TAB) 10 MG (21) TBPK tablet Take by mouth daily for 6  days. Take 6 tablets on day 1, 5 tablets on day 2, 4 tablets on day 3, 3 tablets on day 4, 2 tablets on day 5, 1 tablet on day 6 11/05/20 11/11/20 Yes Faustino Congress, NP  albuterol (VENTOLIN HFA) 108 (90 Base) MCG/ACT inhaler Inhale 1-2 puffs into the lungs every 6 (six) hours as needed for wheezing or shortness of breath. 02/22/20   Avegno, Darrelyn Hillock, FNP  desipramine (NOPRAMIN) 10 MG tablet Take 0.5 tablets (5 mg total) by mouth at bedtime. 09/04/19 10/04/19  Thornton Park, MD  dicyclomine (BENTYL) 20 MG tablet Take 1 tablet (20 mg total) by mouth 4 (four) times daily as needed for spasms. 06/19/19   Thornton Park, MD  famotidine (PEPCID) 20 MG tablet Take 1 tablet (20 mg total) by mouth 2 (two) times daily. 05/17/19   Thornton Park, MD  famotidine (PEPCID) 20 MG tablet Take 1 tablet (20 mg total) by mouth 2 (two) times daily. 07/26/19   Thornton Park, MD  ibuprofen (ADVIL) 200 MG tablet Take 400 mg by mouth every 6 (six) hours as needed.    [provider]  Melatonin 3 MG TABS Take 1 tablet by mouth at bedtime as needed.    [provider]  ondansetron (ZOFRAN) 4 MG tablet Take 1-2 tablets (4-8 mg  total) by mouth every 6 (six) hours as needed for nausea or vomiting. 03/30/19   Raylene Everts, MD  pantoprazole (PROTONIX) 40 MG tablet Take 1 tablet (40 mg total) by mouth 2 (two) times daily before a meal. 06/19/19   Thornton Park, MD  rizatriptan (MAXALT-MLT) 10 MG disintegrating tablet Take 1 tablet (10 mg total) by mouth as needed for migraine. May repeat in 2 hours if needed 05/21/19   Penumalli, Earlean Polka, MD  topiramate (TOPAMAX) 50 MG tablet Take 1 tablet (50 mg total) by mouth 2 (two) times daily. 05/21/19   Penumalli, Earlean Polka, MD    Family History Family History  Problem Relation Age of Onset  . Hepatitis Sister   . Colon cancer Neg Hx   . Esophageal cancer Neg Hx   . Pancreatic cancer Neg Hx   . Stomach cancer Neg Hx   . Rectal cancer Neg Hx      Social History Social History   Tobacco Use  . Smoking status: Current Every Day Smoker    Packs/day: 0.50    Types: Cigarettes  . Smokeless tobacco: Never Used  Vaping Use  . Vaping Use: Never used  Substance Use Topics  . Alcohol use: Not Currently  . Drug use: No     Allergies   Penicillins, Sulfa antibiotics, and Cephalexin   Review of Systems Review of Systems   Physical Exam Triage Vital Signs ED Triage Vitals  Enc Vitals Group     BP 11/05/20 0921 136/89     Pulse Rate 11/05/20 0921 76     Resp 11/05/20 0921 18     Temp 11/05/20 0921 98.7 F (37.1 C)     Temp Source 11/05/20 0921 Oral     SpO2 11/05/20 0921 97 %     Weight --      Height --      Head Circumference --      Peak Flow --      Pain Score 11/05/20 0920 2     Pain Loc --      Pain Edu? --      Excl. in [place]? --    No data found.  Updated Vital Signs BP 136/89 (BP Location: Right Arm)   Pulse 76   Temp 98.7 F (37.1 C) (Oral)   Resp 18   SpO2 97%   Visual Acuity Right Eye Distance:   Left Eye Distance:   Bilateral Distance:    Right Eye Near:   Left Eye Near:    Bilateral Near:     Physical Exam Vitals and nursing note reviewed.  Constitutional:      General: He is not in acute distress.    Appearance: Normal appearance. He is well-developed. He is ill-appearing.  HENT:     Head: Normocephalic and atraumatic.     Right Ear: A middle ear effusion is present. Tympanic membrane is erythematous and bulging.     Left Ear: A middle ear effusion is present. Tympanic membrane is erythematous and bulging.     Nose: Congestion present.     Mouth/Throat:     Mouth: Mucous membranes are moist.     Pharynx: Posterior oropharyngeal erythema present.  Eyes:     Extraocular Movements: Extraocular movements intact.     Conjunctiva/sclera: Conjunctivae normal.     Pupils: Pupils are equal, round, and reactive to light.  Cardiovascular:     Rate and Rhythm: Normal rate and regular  rhythm.  Heart sounds: Normal heart sounds. No murmur heard.   Pulmonary:     Effort: Pulmonary effort is normal. No respiratory distress.     Breath sounds: Normal breath sounds. No stridor. No wheezing, rhonchi or rales.  Chest:     Chest wall: No tenderness.  Abdominal:     General: There is no distension.     Palpations: Abdomen is soft. There is no mass.     Tenderness: There is no abdominal tenderness. There is no right CVA tenderness, left CVA tenderness, guarding or rebound.     Hernia: No hernia is present.  Musculoskeletal:        General: Normal range of motion.     Cervical back: Normal range of motion and neck supple.  Lymphadenopathy:     Cervical: Cervical adenopathy present.  Skin:    General: Skin is warm and dry.     Capillary Refill: Capillary refill takes less than 2 seconds.  Neurological:     General: No focal deficit present.     Mental Status: He is alert and oriented to person, place, and time.  Psychiatric:        Mood and Affect: Mood normal.        Behavior: Behavior normal.        Thought Content: Thought content normal.      UC Treatments / Results  Labs (all labs ordered are listed, but only abnormal results are displayed) Labs Reviewed  NOVEL CORONAVIRUS, NAA    EKG   Radiology No results found.  Procedures Procedures (including critical care time)  Medications Ordered in UC Medications - No data to display  Initial Impression / Assessment and Plan / UC Course  I have reviewed the triage vital signs and the nursing notes.  Pertinent labs & imaging results that were available during my care of the patient were reviewed by me and considered in my medical decision making (see chart for details).    Bilateral otitis media COVID exposure  Clarithromycin prescribed twice daily x7 days Steroid taper prescribed Covid swab obtained in office today.   Patient instructed to quarantine until results are back and negative.   If  results are negative, patient may resume daily schedule as tolerated once they are fever free for 24 hours without the use of antipyretic medications.   If results are positive, patient instructed to quarantine for at least 5 days from symptom onset.  If after 5 days symptoms have resolved, may return to work with a well fitting mask for the next 5 days. If symptomatic after day 5, isolation should be extended to 10 days. Patient instructed to follow-up with primary care or with this office as needed.   Patient instructed to follow-up in the ER for trouble swallowing, trouble breathing, other concerning symptoms.   Final Clinical Impressions(s) / UC Diagnoses   Final diagnoses:  Non-recurrent acute suppurative otitis media of both ears without spontaneous rupture of tympanic membranes  Close exposure to COVID-19 virus     Discharge Instructions     I have prescribed clarithromycin twice a day for 7 days  I have sent in a prednisone taper for you to take for 6 days. 6 tablets on day one, 5 tablets on day two, 4 tablets on day three, 3 tablets on day four, 2 tablets on day five, and 1 tablet on day six.  Follow up with this office or with primary care if symptoms are persisting.  Follow up in the ER  for high fever, trouble swallowing, trouble breathing, other concerning symptoms.     ED Prescriptions    Medication Sig Dispense Auth. Provider   clarithromycin (BIAXIN) 500 MG tablet Take 1 tablet (500 mg total) by mouth 2 (two) times daily for 7 days. 14 tablet Faustino Congress, NP   predniSONE (STERAPRED UNI-PAK 21 TAB) 10 MG (21) TBPK tablet Take by mouth daily for 6 days. Take 6 tablets on day 1, 5 tablets on day 2, 4 tablets on day 3, 3 tablets on day 4, 2 tablets on day 5, 1 tablet on day 6 21 tablet Faustino Congress, NP     PDMP not reviewed this encounter.   Faustino Congress, NP 11/05/20 1058

## 2020-11-06 LAB — NOVEL CORONAVIRUS, NAA: SARS-CoV-2, NAA: DETECTED — AB

## 2020-11-06 LAB — SARS-COV-2, NAA 2 DAY TAT

## 2022-02-19 ENCOUNTER — Encounter: Payer: Self-pay | Admitting: Gastroenterology

## 2023-02-23 ENCOUNTER — Encounter: Payer: Self-pay | Admitting: Gastroenterology

## 2023-02-24 ENCOUNTER — Telehealth: Payer: Self-pay

## 2023-02-24 ENCOUNTER — Ambulatory Visit (AMBULATORY_SURGERY_CENTER): Payer: 59

## 2023-02-24 VITALS — Ht 70.0 in | Wt 215.0 lb

## 2023-02-24 DIAGNOSIS — Z86010 Personal history of colonic polyps: Secondary | ICD-10-CM

## 2023-02-24 MED ORDER — NA SULFATE-K SULFATE-MG SULF 17.5-3.13-1.6 GM/177ML PO SOLN: 1.0000 | Freq: Once | ORAL | 0 refills | Status: AC

## 2023-02-24 NOTE — Progress Notes (Signed)

## 2023-02-24 NOTE — Telephone Encounter (Signed)
Called back and able to complete Pre visit

## 2023-02-24 NOTE — Telephone Encounter (Signed)
Called at 11 & 1106 for PV appt.. No answer.  Left message that I would call back in 5 min

## 2023-03-14 ENCOUNTER — Encounter: Payer: Self-pay | Admitting: Gastroenterology

## 2023-03-14 ENCOUNTER — Telehealth: Payer: Self-pay | Admitting: Gastroenterology

## 2023-03-14 NOTE — Telephone Encounter (Signed)
If with some mild cold, will be fine to proceed but if he is congested and coughing, is better to reschedule.  I am CCing John Nulty to weigh in.  Thanks

## 2023-03-14 NOTE — Telephone Encounter (Signed)
Spoke with pt- he is feeling "congested and has a frequent cough with large amt of sputum."  States he is still feeling badly and he wishes to reschedule.  RSC to 04-12-23 at 2:00 pm.  New instructions mailed to pt.

## 2023-03-14 NOTE — Telephone Encounter (Signed)
Inbound call from patient, wanting to speak with a nurse in regards to procedure scheduled tomorrow. Patient states over the weekend he has developed a runny nose and congestion and would like to know if he can continue with procedure.

## 2023-03-15 ENCOUNTER — Encounter: Payer: Commercial Managed Care - PPO | Admitting: Gastroenterology

## 2023-04-12 ENCOUNTER — Encounter: Payer: Self-pay | Admitting: Gastroenterology

## 2023-04-12 ENCOUNTER — Ambulatory Visit: Payer: Commercial Managed Care - PPO | Admitting: Gastroenterology

## 2023-04-12 VITALS — BP 108/67 | HR 55 | Temp 98.2°F | Resp 23 | Ht 70.0 in | Wt 215.0 lb

## 2023-04-12 DIAGNOSIS — Z860100 Personal history of colon polyps, unspecified: Secondary | ICD-10-CM

## 2023-04-12 DIAGNOSIS — Z09 Encounter for follow-up examination after completed treatment for conditions other than malignant neoplasm: Secondary | ICD-10-CM | POA: Diagnosis present

## 2023-04-12 DIAGNOSIS — D123 Benign neoplasm of transverse colon: Secondary | ICD-10-CM

## 2023-04-12 DIAGNOSIS — D125 Benign neoplasm of sigmoid colon: Secondary | ICD-10-CM | POA: Diagnosis not present

## 2023-04-12 DIAGNOSIS — K649 Unspecified hemorrhoids: Secondary | ICD-10-CM

## 2023-04-12 MED ORDER — PRAMOXINE-HC 1-1 % EX CREA: TOPICAL_CREAM | CUTANEOUS | 0 refills | Status: AC

## 2023-04-12 MED ORDER — SODIUM CHLORIDE 0.9 % IV SOLN: 500.0000 mL | INTRAVENOUS | Status: DC

## 2023-04-12 NOTE — Progress Notes (Signed)
Called to room to assist during endoscopic procedure.  Patient ID and intended procedure confirmed with present staff. Received instructions for my participation in the procedure from the performing physician.  

## 2023-04-12 NOTE — Progress Notes (Signed)
Pt's states no medical or surgical changes since previsit or office visit. 

## 2023-04-12 NOTE — Op Note (Signed)
[facility] Patient Name: James Andrade Procedure Date: 04/12/2023 2:10 PM MRN: [id number] Endoscopist: Napoleon Form , MD, [id number] Age: 32 Referring MD:  Date of Birth: 11/18/1990 Gender: Male Account #: [id number] Procedure:                Colonoscopy Indications:              High risk colon cancer surveillance: Personal                            history of multiple (3 or more) adenomas, High risk                            colon cancer surveillance: Personal history of                            adenoma less than 10 mm in size Medicines:                Monitored Anesthesia Care Procedure:                Pre-Anesthesia Assessment:                           - Prior to the procedure, a History and Physical                            was performed, and patient medications and                            allergies were reviewed. The patient's tolerance of                            previous anesthesia was also reviewed. The risks                            and benefits of the procedure and the sedation                            options and risks were discussed with the patient.                            All questions were answered, and informed consent                            was obtained. Prior Anticoagulants: The patient has                            taken no anticoagulant or antiplatelet agents. ASA                            Grade Assessment: II - A patient with mild systemic                            disease. After reviewing the risks and benefits,  the patient was deemed in satisfactory condition to                            undergo the procedure.                           After obtaining informed consent, the colonoscope                            was passed under direct vision. Throughout the                            procedure, the patient's blood pressure, pulse, and                            oxygen saturations  were monitored continuously. The                            Olympus Scope [id number] was introduced through the                            anus and advanced to the the cecum, identified by                            appendiceal orifice and ileocecal valve. The                            colonoscopy was performed without difficulty. The                            patient tolerated the procedure well. The quality                            of the bowel preparation was good. The ileocecal                            valve, appendiceal orifice, and rectum were                            photographed. Scope In: 2:19:37 PM Scope Out: 2:34:18 PM Scope Withdrawal Time: 0 hours 12 minutes 4 seconds  Total Procedure Duration: 0 hours 14 minutes 41 seconds  Findings:                 The perianal and digital rectal examinations were                            normal.                           Two sessile polyps were found in the sigmoid colon                            and transverse colon. The polyps were 3 to 5 mm in  size. These polyps were removed with a cold snare.                            Resection was complete, but the polyp tissue was                            only partially retrieved.                           Non-bleeding external and internal hemorrhoids were                            found during retroflexion. The hemorrhoids were                            small. Complications:            No immediate complications. Estimated Blood Loss:     Estimated blood loss was minimal. Impression:               - Two 3 to 5 mm polyps in the sigmoid colon and in                            the transverse colon, removed with a cold snare.                            Complete resection. Partial retrieval.                           - Non-bleeding external and internal hemorrhoids. Recommendation:           - Patient has a contact number available for                             emergencies. The signs and symptoms of potential                            delayed complications were discussed with the                            patient. Return to normal activities tomorrow.                            Written discharge instructions were provided to the                            patient.                           - Resume previous diet.                           - Continue present medications.                           - Await pathology results.                           -  Repeat colonoscopy in 5 years for surveillance. Napoleon Form, MD 04/12/2023 2:42:26 PM This report has been signed electronically.

## 2023-04-12 NOTE — Progress Notes (Signed)
Sedate, gd SR, tolerated procedure well, VSS, report to RN 

## 2023-04-12 NOTE — Patient Instructions (Signed)
Handouts on polyps & hemorrhoids given.  Continue present medications.  Resume previous diet  Awaiting pathology reports.  YOU HAD AN ENDOSCOPIC PROCEDURE TODAY AT THE [facility]:   Refer to the procedure report that was given to you for any specific questions about what was found during the examination.  If the procedure report does not answer your questions, please call your gastroenterologist to clarify.  If you requested that your care partner not be given the details of your procedure findings, then the procedure report has been included in a sealed envelope for you to review at your convenience later.  YOU SHOULD EXPECT: Some feelings of bloating in the abdomen. Passage of more gas than usual.  Walking can help get rid of the air that was put into your GI tract during the procedure and reduce the bloating. If you had a lower endoscopy (such as a colonoscopy or flexible sigmoidoscopy) you may notice spotting of blood in your stool or on the toilet paper. If you underwent a bowel prep for your procedure, you may not have a normal bowel movement for a few days.  Please Note:  You might notice some irritation and congestion in your nose or some drainage.  This is from the oxygen used during your procedure.  There is no need for concern and it should clear up in a day or so.  SYMPTOMS TO REPORT IMMEDIATELY:  Following lower endoscopy (colonoscopy or flexible sigmoidoscopy):  Excessive amounts of blood in the stool  Significant tenderness or worsening of abdominal pains  Swelling of the abdomen that is new, acute  Fever of 100F or higher   For urgent or emergent issues, a gastroenterologist can be reached at any hour by calling [phone]. Do not use MyChart messaging for urgent concerns.    DIET:  We do recommend a small meal at first, but then you may proceed to your regular diet.  Drink plenty of fluids but you should avoid alcoholic beverages for 24  hours.  ACTIVITY:  You should plan to take it easy for the rest of today and you should NOT DRIVE or use heavy machinery until tomorrow (because of the sedation medicines used during the test).    FOLLOW UP: Our staff will call the number listed on your records the next business day following your procedure.  We will call around 7:15- 8:00 am to check on you and address any questions or concerns that you may have regarding the information given to you following your procedure. If we do not reach you, we will leave a message.     If any biopsies were taken you will be contacted by phone or by letter within the next 1-3 weeks.  Please call us at [phone] if you have not heard about the biopsies in 3 weeks.    SIGNATURES/CONFIDENTIALITY: You and/or your care partner have signed paperwork which will be entered into your electronic medical record.  These signatures attest to the fact that that the information above on your After Visit Summary has been reviewed and is understood.  Full responsibility of the confidentiality of this discharge information lies with you and/or your care-partner.

## 2023-04-12 NOTE — Progress Notes (Signed)
[facility] History and Physical   Primary Care Physician:  Merri Brunette, MD   Reason for Procedure:  History of adenomatous colon polyps  Plan:    Surveillance colonoscopy with possible interventions as needed     HPI: James Andrade is a very pleasant 32 y.o. male here for surveillance colonoscopy. Denies any nausea, vomiting, abdominal pain, melena or bright red blood per rectum  The risks and benefits as well as alternatives of endoscopic procedure(s) have been discussed and reviewed. All questions answered. The patient agrees to proceed.    Past Medical History:  Diagnosis Date   Allergy    Asthma    childhood   Blood transfusion without reported diagnosis 2010   thinks after head injury   Chronic headaches    Depression    states after head injury, not on meds   Hyperlipidemia    not on meds   Seizures (HCC) 2010   did after brain trauma   Visual changes     Past Surgical History:  Procedure Laterality Date   BRAIN SURGERY  2010   hematoma from fall onto back of head, Dr Newell Coral   COLONOSCOPY  01/2019   UPPER GASTROINTESTINAL ENDOSCOPY  05/17/2019   WISDOM TOOTH EXTRACTION  2012    Prior to Admission medications   Medication Sig Start Date End Date Taking? Authorizing Provider  desipramine (NOPRAMIN) 10 MG tablet Take 0.5 tablets (5 mg total) by mouth at bedtime. 09/04/19 10/04/19  Tressia Danas, MD  ibuprofen (ADVIL) 200 MG tablet Take 400 mg by mouth every 6 (six) hours as needed. Patient not taking: Reported on 04/12/2023    [provider]    Current Outpatient Medications  Medication Sig Dispense Refill   desipramine (NOPRAMIN) 10 MG tablet Take 0.5 tablets (5 mg total) by mouth at bedtime. 15 tablet 6   ibuprofen (ADVIL) 200 MG tablet Take 400 mg by mouth every 6 (six) hours as needed. (Patient not taking: Reported on 04/12/2023)     Current Facility-Administered Medications  Medication Dose Route Frequency  Provider Last Rate Last Admin   0.9 %  sodium chloride infusion  500 mL Intravenous Continuous Hobson Lax, Eleonore Chiquito, MD        Allergies as of 04/12/2023 - Review Complete 04/12/2023  Allergen Reaction Noted   Penicillins Swelling    Sulfa antibiotics Hives 08/21/2018   Cephalexin Swelling     Family History  Problem Relation Age of Onset   Hepatitis Sister    Colon cancer Neg Hx    Esophageal cancer Neg Hx    Pancreatic cancer Neg Hx    Stomach cancer Neg Hx    Rectal cancer Neg Hx    Colon polyps Neg Hx     Social History   Socioeconomic History   Marital status: Married    Spouse name: Lauren   Number of children: 0   Years of education: 12   Highest education level: Not on file  Occupational History   Occupation: Emergency planning/management officer    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  Tobacco Use   Smoking status: Every Day    Current packs/day: 0.50    Types: Cigarettes   Smokeless tobacco: Never  Vaping Use   Vaping status: Never Used  Substance and Sexual Activity   Alcohol use: Not Currently   Drug use: No   Sexual activity: Not on file  Other Topics Concern   Not on file  Social History Narrative   Lives with wife, children  Caffeine- 1-2 c daily   Social Determinants of Health   Financial Resource Strain: Not on file  Food Insecurity: Not on file  Transportation Needs: Not on file  Physical Activity: Not on file  Stress: Not on file  Social Connections: Not on file  Intimate Partner Violence: Not on file    Review of Systems:  All other review of systems negative except as mentioned in the HPI.  Physical Exam: Vital signs in last 24 hours: BP 128/74   Pulse (!) 58   Temp 98.2 F (36.8 C)   Ht 5\' 10"  (1.778 m)   Wt 215 lb (97.5 kg)   SpO2 99%   BMI 30.85 kg/m  General:   Alert, NAD Lungs:  Clear .   Heart:  Regular rate and rhythm Abdomen:  Soft, nontender and nondistended. Neuro/Psych:  Alert and cooperative. Normal mood and affect. A and O x  3  Reviewed labs, radiology imaging, old records and pertinent past GI work up  Patient is appropriate for planned procedure(s) and anesthesia in an ambulatory setting   K. Scherry Ran , MD [phone]

## 2023-04-13 ENCOUNTER — Telehealth: Payer: Self-pay | Admitting: *Deleted

## 2023-04-13 NOTE — Telephone Encounter (Signed)
  Follow up Call-     04/12/2023    1:17 PM  Call back number  Post procedure Call Back phone  # [phone]  Permission to leave phone message Yes     Patient questions:  Do you have a fever, pain , or abdominal swelling? No. Pain Score  0 *  Have you tolerated food without any problems? Yes.    Have you been able to return to your normal activities? Yes.    Do you have any questions about your discharge instructions: Diet   No. Medications  No. Follow up visit  No.  Do you have questions or concerns about your Care? No.  Actions: * If pain score is 4 or above: No action needed, pain <4.

## 2023-04-15 LAB — SURGICAL PATHOLOGY

## 2023-04-19 ENCOUNTER — Encounter: Payer: Self-pay | Admitting: Gastroenterology
